# Patient Record
Sex: Female | Born: 1995 | Race: Black or African American | Hispanic: No | Marital: Single | Smoking: Never smoker
Health system: Southern US, Community
[De-identification: ages and names within clinical notes are randomized; demographics above are authoritative.]

## PROBLEM LIST (undated history)

## (undated) ENCOUNTER — Emergency Department (HOSPITAL_BASED_OUTPATIENT_CLINIC_OR_DEPARTMENT_OTHER): Admission: EM | Payer: No Typology Code available for payment source | Source: Home / Self Care

## (undated) DIAGNOSIS — Z789 Other specified health status: Secondary | ICD-10-CM

## (undated) HISTORY — PX: NO PAST SURGERIES: SHX2092

---
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
## 2014-04-25 ENCOUNTER — Inpatient Hospital Stay (HOSPITAL_COMMUNITY)
Admission: AD | Admit: 2014-04-25 | Discharge: 2014-04-25 | Discharge status: Against Medical Advice | Payer: Medicaid Other | Source: Ambulatory Visit | Attending: Obstetrics & Gynecology | Admitting: Obstetrics & Gynecology

## 2014-04-25 DIAGNOSIS — Z3200 Encounter for pregnancy test, result unknown: Secondary | ICD-10-CM | POA: Diagnosis present

## 2014-04-25 DIAGNOSIS — Z3202 Encounter for pregnancy test, result negative: Secondary | ICD-10-CM | POA: Diagnosis not present

## 2014-04-25 DIAGNOSIS — Z3046 Encounter for surveillance of implantable subdermal contraceptive: Secondary | ICD-10-CM | POA: Insufficient documentation

## 2014-04-25 LAB — POCT PREGNANCY, URINE: PREG TEST UR: NEGATIVE

## 2014-04-25 NOTE — MAU Note (Signed)
Denies pain, bleeding or discharge. No N/V.

## 2014-04-25 NOTE — MAU Note (Signed)
Not in lobby

## 2014-04-25 NOTE — MAU Note (Signed)
Not in lobby#1 

## 2014-04-25 NOTE — MAU Note (Signed)
Patient states she has had an Implanon for 2 years. States the implant site has been itching for 1-2 months. Wants a pregnancy test.

## 2014-11-06 ENCOUNTER — Encounter (HOSPITAL_BASED_OUTPATIENT_CLINIC_OR_DEPARTMENT_OTHER): Payer: Self-pay | Admitting: *Deleted

## 2014-11-06 ENCOUNTER — Emergency Department (HOSPITAL_BASED_OUTPATIENT_CLINIC_OR_DEPARTMENT_OTHER): Payer: Medicaid Other

## 2014-11-06 ENCOUNTER — Emergency Department (HOSPITAL_BASED_OUTPATIENT_CLINIC_OR_DEPARTMENT_OTHER)
Admission: EM | Admit: 2014-11-06 | Discharge: 2014-11-06 | Disposition: A | Discharge status: Home / Self Care | Payer: Medicaid Other | Attending: Emergency Medicine | Admitting: Emergency Medicine

## 2014-11-06 DIAGNOSIS — N898 Other specified noninflammatory disorders of vagina: Secondary | ICD-10-CM | POA: Insufficient documentation

## 2014-11-06 DIAGNOSIS — Z3202 Encounter for pregnancy test, result negative: Secondary | ICD-10-CM | POA: Insufficient documentation

## 2014-11-06 DIAGNOSIS — R109 Unspecified abdominal pain: Secondary | ICD-10-CM

## 2014-11-06 DIAGNOSIS — K5900 Constipation, unspecified: Secondary | ICD-10-CM | POA: Insufficient documentation

## 2014-11-06 DIAGNOSIS — R1084 Generalized abdominal pain: Secondary | ICD-10-CM

## 2014-11-06 DIAGNOSIS — R140 Abdominal distension (gaseous): Secondary | ICD-10-CM | POA: Insufficient documentation

## 2014-11-06 LAB — CBC WITH DIFFERENTIAL/PLATELET
BASOS ABS: 0 10*3/uL (ref 0.0–0.1)
Basophils Relative: 0 % (ref 0–1)
EOS ABS: 0.1 10*3/uL (ref 0.0–0.7)
EOS PCT: 1 % (ref 0–5)
HCT: 38.9 % (ref 36.0–46.0)
HEMOGLOBIN: 13.1 g/dL (ref 12.0–15.0)
LYMPHS ABS: 2.1 10*3/uL (ref 0.7–4.0)
Lymphocytes Relative: 22 % (ref 12–46)
MCH: 33.1 pg (ref 26.0–34.0)
MCHC: 33.7 g/dL (ref 30.0–36.0)
MCV: 98.2 fL (ref 78.0–100.0)
MONO ABS: 0.6 10*3/uL (ref 0.1–1.0)
MONOS PCT: 7 % (ref 3–12)
NEUTROS PCT: 70 % (ref 43–77)
Neutro Abs: 6.5 10*3/uL (ref 1.7–7.7)
PLATELETS: 337 10*3/uL (ref 150–400)
RBC: 3.96 MIL/uL (ref 3.87–5.11)
RDW: 11.6 % (ref 11.5–15.5)
WBC: 9.3 10*3/uL (ref 4.0–10.5)

## 2014-11-06 LAB — URINE MICROSCOPIC-ADD ON

## 2014-11-06 LAB — WET PREP, GENITAL
Trich, Wet Prep: NONE SEEN
Yeast Wet Prep HPF POC: NONE SEEN

## 2014-11-06 LAB — URINALYSIS, ROUTINE W REFLEX MICROSCOPIC
Bilirubin Urine: NEGATIVE
GLUCOSE, UA: NEGATIVE mg/dL
Hgb urine dipstick: NEGATIVE
Ketones, ur: NEGATIVE mg/dL
LEUKOCYTES UA: NEGATIVE
Nitrite: POSITIVE — AB
PH: 7.5 (ref 5.0–8.0)
PROTEIN: NEGATIVE mg/dL
Specific Gravity, Urine: 1.017 (ref 1.005–1.030)
UROBILINOGEN UA: 1 mg/dL (ref 0.0–1.0)

## 2014-11-06 LAB — BASIC METABOLIC PANEL
Anion gap: 4 — ABNORMAL LOW (ref 5–15)
BUN: 10 mg/dL (ref 6–23)
CALCIUM: 9 mg/dL (ref 8.4–10.5)
CO2: 28 mmol/L (ref 19–32)
CREATININE: 0.66 mg/dL (ref 0.50–1.10)
Chloride: 108 mmol/L (ref 96–112)
GFR calc non Af Amer: >90 mL/min (ref >90)
Glucose, Bld: 82 mg/dL (ref 70–99)
Potassium: 3.5 mmol/L (ref 3.5–5.1)
Sodium: 140 mmol/L (ref 135–145)

## 2014-11-06 LAB — PREGNANCY, URINE: PREG TEST UR: NEGATIVE

## 2014-11-06 LAB — HCG, QUANTITATIVE, PREGNANCY: hCG, Beta Chain, Quant, S: 1 m[IU]/mL (ref <5)

## 2014-11-06 NOTE — ED Notes (Signed)
Abdominal pain x 2 weeks. Vaginal discharge and constipation. LMP in October. Her abdomen looks gravid.

## 2014-11-06 NOTE — ED Provider Notes (Signed)
CSN: [id number]     Arrival date & time 11/06/14  1349 History   First MD Initiated Contact with Patient 11/06/14 1400     Chief Complaint  Patient presents with  . Abdominal Pain     (Consider location/radiation/quality/duration/timing/severity/associated sxs/prior Treatment) HPI Comments: Patient is a 19 year old female who presents with complaints of generalized abdominal discomfort that has been occurring intermittently for the past 2 weeks. She feels somewhat constipated and also reports a vaginal discharge. She denies any bleeding or spotting.  Her last menstrual period was in October and she admits to the possibility of pregnancy.  Patient is a 19 y.o. female presenting with abdominal pain. The history is provided by the patient.  Abdominal Pain Pain location:  Generalized Pain quality: cramping   Pain radiates to:  Does not radiate Pain severity:  Moderate Onset quality:  Gradual Duration:  2 weeks Timing:  Intermittent Progression:  Worsening Chronicity:  New Relieved by:  Nothing Worsened by:  Nothing tried   History reviewed. No pertinent past medical history. History reviewed. No pertinent past surgical history. No family history on file. History  Substance Use Topics  . Smoking status: Never Smoker   . Smokeless tobacco: Not on file  . Alcohol Use: No   OB History    No data available     Review of Systems  Gastrointestinal: Positive for abdominal pain.  All other systems reviewed and are negative.     Allergies  Review of patient's allergies indicates no known allergies.  Home Medications   Prior to Admission medications   Medication Sig Start Date End Date Taking? Authorizing Provider  traMADol (ULTRAM) 50 MG tablet Take 1 tablet (50 mg total) by mouth every 6 (six) hours as needed for pain. 12/22/12   Gerhard Munch, MD   BP 98/64 mmHg  Pulse 88  Temp(Src) 97.9 F (36.6 C) (Oral)  Resp 20  Ht  (1.651 m)  Wt 162 lb (73.483 kg)  BMI  26.96 kg/m2  SpO2 100%  LMP 07/05/2014 Physical Exam  Constitutional: She is oriented to person, place, and time. She appears well-developed and well-nourished. No distress.  HENT:  Head: Normocephalic and atraumatic.  Neck: Normal range of motion. Neck supple.  Cardiovascular: Normal rate and regular rhythm.  Exam reveals no gallop and no friction rub.   No murmur heard. Pulmonary/Chest: Effort normal and breath sounds normal. No respiratory distress. She has no wheezes.  Abdominal: Soft. Bowel sounds are normal. She exhibits distension. There is no tenderness.  The lower abdomen is somewhat distended and appears gravid.  Musculoskeletal: Normal range of motion.  Neurological: She is alert and oriented to person, place, and time.  Skin: Skin is warm and dry. She is not diaphoretic.  Nursing note and vitals reviewed.   ED Course  Procedures (including critical care time) Labs Review Labs Reviewed  WET PREP, GENITAL  PREGNANCY, URINE  URINALYSIS, ROUTINE W REFLEX MICROSCOPIC  BASIC METABOLIC PANEL  CBC WITH DIFFERENTIAL/PLATELET  HCG, QUANTITATIVE, PREGNANCY  GC/CHLAMYDIA PROBE AMP ([place])    Imaging Review No results found.   EKG Interpretation None      MDM   Final diagnoses:  None    Asian presents with complaints of abdominal discomfort, vaginal discharge, and constipation. There is mild tenderness to palpation in all 4 quadrants but no rebound and no guarding. There is no white count and ultrasound reveals no acute abnormalities. Pelvic examination was performed which revealed scant discharge and no significant abnormality  on the wet prep. She will be discharged home, to return as needed she develops other problems.    Geoffery Lyonsouglas Kendarious Gudino, MD 11/06/14 1520

## 2014-11-06 NOTE — Discharge Instructions (Signed)
Magnesium citrate: Drank the entire 10 ounce bottle mixed with equal parts Sprite or Gatorade for relief of constipation.  Return to the emergency department if your symptoms substantially worsen or change.   Abdominal Pain Many things can cause abdominal pain. Usually, abdominal pain is not caused by a disease and will improve without treatment. It can often be observed and treated at home. Your health care provider will do a physical exam and possibly order blood tests and X-rays to help determine the seriousness of your pain. However, in many cases, more time must pass before a clear cause of the pain can be found. Before that point, your health care provider may not know if you need more testing or further treatment. HOME CARE INSTRUCTIONS  Monitor your abdominal pain for any changes. The following actions may help to alleviate any discomfort you are experiencing:  Only take over-the-counter or prescription medicines as directed by your health care provider.  Do not take laxatives unless directed to do so by your health care provider.  Try a clear liquid diet (broth, tea, or water) as directed by your health care provider. Slowly move to a bland diet as tolerated. SEEK MEDICAL CARE IF:  You have unexplained abdominal pain.  You have abdominal pain associated with nausea or diarrhea.  You have pain when you urinate or have a bowel movement.  You experience abdominal pain that wakes you in the night.  You have abdominal pain that is worsened or improved by eating food.  You have abdominal pain that is worsened with eating fatty foods.  You have a fever. SEEK IMMEDIATE MEDICAL CARE IF:   Your pain does not go away within 2 hours.  You keep throwing up (vomiting).  Your pain is felt only in portions of the abdomen, such as the right side or the left lower portion of the abdomen.  You pass bloody or black tarry stools. MAKE SURE YOU:  Understand these instructions.   Will  watch your condition.   Will get help right away if you are not doing well or get worse.  Document Released: 07/01/2005 Document Revised: 09/26/2013 Document Reviewed: 05/31/2013 [facility] Patient Information 2015 [employer] [place]. This information is not intended to replace advice given to you by your health care provider. Make sure you discuss any questions you have with your health care provider.

## 2014-11-07 LAB — GC/CHLAMYDIA PROBE AMP (~~LOC~~) NOT AT ARMC
Chlamydia: NEGATIVE
Neisseria Gonorrhea: NEGATIVE

## 2014-12-27 ENCOUNTER — Emergency Department (HOSPITAL_BASED_OUTPATIENT_CLINIC_OR_DEPARTMENT_OTHER)
Admission: EM | Admit: 2014-12-27 | Discharge: 2014-12-27 | Disposition: A | Discharge status: Home / Self Care | Payer: Medicaid Other | Attending: Emergency Medicine | Admitting: Emergency Medicine

## 2014-12-27 ENCOUNTER — Encounter (HOSPITAL_BASED_OUTPATIENT_CLINIC_OR_DEPARTMENT_OTHER): Payer: Self-pay | Admitting: *Deleted

## 2014-12-27 DIAGNOSIS — Z3202 Encounter for pregnancy test, result negative: Secondary | ICD-10-CM | POA: Insufficient documentation

## 2014-12-27 DIAGNOSIS — N939 Abnormal uterine and vaginal bleeding, unspecified: Secondary | ICD-10-CM | POA: Insufficient documentation

## 2014-12-27 LAB — URINE MICROSCOPIC-ADD ON

## 2014-12-27 LAB — URINALYSIS, ROUTINE W REFLEX MICROSCOPIC
Bilirubin Urine: NEGATIVE
GLUCOSE, UA: NEGATIVE mg/dL
Ketones, ur: NEGATIVE mg/dL
Nitrite: NEGATIVE
PH: 8.5 — AB (ref 5.0–8.0)
PROTEIN: 30 mg/dL — AB
Specific Gravity, Urine: 1.02 (ref 1.005–1.030)
Urobilinogen, UA: 1 mg/dL (ref 0.0–1.0)

## 2014-12-27 LAB — CBC WITH DIFFERENTIAL/PLATELET
BASOS PCT: 0 % (ref 0–1)
Basophils Absolute: 0 10*3/uL (ref 0.0–0.1)
EOS PCT: 2 % (ref 0–5)
Eosinophils Absolute: 0.2 10*3/uL (ref 0.0–0.7)
HEMATOCRIT: 36.3 % (ref 36.0–46.0)
HEMOGLOBIN: 12 g/dL (ref 12.0–15.0)
Lymphocytes Relative: 20 % (ref 12–46)
Lymphs Abs: 1.5 10*3/uL (ref 0.7–4.0)
MCH: 32.5 pg (ref 26.0–34.0)
MCHC: 33.1 g/dL (ref 30.0–36.0)
MCV: 98.4 fL (ref 78.0–100.0)
MONO ABS: 0.6 10*3/uL (ref 0.1–1.0)
MONOS PCT: 8 % (ref 3–12)
NEUTROS ABS: 5.1 10*3/uL (ref 1.7–7.7)
Neutrophils Relative %: 70 % (ref 43–77)
Platelets: 301 10*3/uL (ref 150–400)
RBC: 3.69 MIL/uL — ABNORMAL LOW (ref 3.87–5.11)
RDW: 11.7 % (ref 11.5–15.5)
WBC: 7.4 10*3/uL (ref 4.0–10.5)

## 2014-12-27 LAB — BASIC METABOLIC PANEL
Anion gap: 5 (ref 5–15)
BUN: 9 mg/dL (ref 6–23)
CALCIUM: 8.7 mg/dL (ref 8.4–10.5)
CO2: 26 mmol/L (ref 19–32)
Chloride: 107 mmol/L (ref 96–112)
Creatinine, Ser: 0.66 mg/dL (ref 0.50–1.10)
GFR calc Af Amer: >90 mL/min (ref >90)
GFR calc non Af Amer: >90 mL/min (ref >90)
GLUCOSE: 94 mg/dL (ref 70–99)
Potassium: 3.7 mmol/L (ref 3.5–5.1)
SODIUM: 138 mmol/L (ref 135–145)

## 2014-12-27 LAB — PREGNANCY, URINE: PREG TEST UR: NEGATIVE

## 2014-12-27 NOTE — ED Provider Notes (Signed)
CSN: [id number]     Arrival date & time 12/27/14  1125 History   First MD Initiated Contact with Patient 12/27/14 1213     Chief Complaint  Patient presents with  . Vaginal Bleeding     (Consider location/radiation/quality/duration/timing/severity/associated sxs/prior Treatment) HPI This is a 19 year old female who presents the emergency department for evaluation of her heavy menstrual cycle. She has no significant past medical history. Patient states that she is going through 1 pad an hour, which is unusual for her." messing up my drawers." The patient is sexually active. She is not using any birth control. She does not wish to get pregnant. When asked if she is using any protection or forms of birth control. She states "well, having gotten pregnant yet." Patient denies any dizziness, racing or skipping in her heart, weakness, or lightheadedness. The patient has an appointment with a gynecologist on Monday. History reviewed. No pertinent past medical history. History reviewed. No pertinent past surgical history. No family history on file. History  Substance Use Topics  . Smoking status: Never Smoker   . Smokeless tobacco: Not on file  . Alcohol Use: No   OB History    No data available     Review of Systems  Constitutional: Negative for fever and chills.  HENT: Negative for trouble swallowing.   Respiratory: Negative for shortness of breath.   Cardiovascular: Negative for chest pain.  Gastrointestinal: Negative for nausea, vomiting, abdominal pain, diarrhea and constipation.  Genitourinary: Positive for vaginal bleeding and menstrual problem. Negative for dysuria, urgency, frequency, hematuria, flank pain, decreased urine volume, vaginal discharge, difficulty urinating, genital sores and pelvic pain.  Musculoskeletal: Negative for myalgias and arthralgias.  Skin: Negative for rash.  Neurological: Negative for numbness.  All other systems reviewed and are  negative.     Allergies  Review of patient's allergies indicates no known allergies.  Home Medications   Prior to Admission medications   Medication Sig Start Date End Date Taking? Authorizing Provider  traMADol (ULTRAM) 50 MG tablet Take 1 tablet (50 mg total) by mouth every 6 (six) hours as needed for pain. 12/22/12   Gerhard Munch, MD   BP 97/75 mmHg  Pulse 72  Temp(Src) 98 F (36.7 C) (Oral)  Resp 18  Ht 5' 5.5" (1.664 m)  Wt 162 lb (73.483 kg)  BMI 26.54 kg/m2  SpO2 100%  LMP 11/26/2014 Physical Exam  Constitutional: She is oriented to person, place, and time. She appears well-developed and well-nourished. No distress.  HENT:  Head: Normocephalic and atraumatic.  Eyes: Conjunctivae are normal. No scleral icterus.  Neck: Normal range of motion.  Cardiovascular: Normal rate, regular rhythm and normal heart sounds.  Exam reveals no gallop and no friction rub.   No murmur heard. Pulmonary/Chest: Effort normal and breath sounds normal. No respiratory distress.  Abdominal: Soft. Bowel sounds are normal. She exhibits no distension and no mass. There is no tenderness. There is no guarding.  Neurological: She is alert and oriented to person, place, and time.  Skin: Skin is warm and dry. She is not diaphoretic.    ED Course  Procedures (including critical care time) Labs Review Labs Reviewed  URINALYSIS, ROUTINE W REFLEX MICROSCOPIC - Abnormal; Notable for the following:    Color, Urine RED (*)    APPearance CLOUDY (*)    pH 8.5 (*)    Hgb urine dipstick LARGE (*)    Protein, ur 30 (*)    Leukocytes, UA SMALL (*)    All  other components within normal limits  CBC WITH DIFFERENTIAL/PLATELET - Abnormal; Notable for the following:    RBC 3.69 (*)    All other components within normal limits  URINE MICROSCOPIC-ADD ON - Abnormal; Notable for the following:    Squamous Epithelial / LPF MANY (*)    Bacteria, UA MANY (*)    All other components within normal limits   PREGNANCY, URINE  BASIC METABOLIC PANEL  HIV ANTIBODY (ROUTINE TESTING)  RPR    Imaging Review No results found.   EKG Interpretation None      MDM   Final diagnoses:  Vaginal bleeding   Filed Vitals:   12/27/14 1134 12/27/14 1137  BP:  97/75  Pulse: 72   Temp: 98 F (36.7 C)   TempSrc: Oral   Resp: 18   Height: 5' 5.5" (1.664 m)   Weight: 162 lb (73.483 kg)   SpO2: 100%    Patient states that she does not want to have a pelvic examination done at this time and that she needs to get to class this afternoon. Patient's blood pressure is low. However, I suspect it is her normal. The patient is able to stand up and sit down easily without signs of dizziness. She is not tachycardic and I doubt that her menstruation is heavy and off to cause symptomatic anemia in any way. Patient is advised to follow-up with her GYN clinic on Monday. I discussed reasons to seek immediate medical care in the emergency department. Her labs show normal hemoglobin, contaminated urine without signs of infection, negative pregnancy.  The patient appears reasonably screened and/or stabilized for discharge and I doubt any other medical condition or other [facility] requiring further screening, evaluation, or treatment in the ED at this time prior to discharge.     Arthor Captainbigail Darryon Bastin, PA-C 12/27/14 1259  Geoffery Lyonsouglas Delo, MD 12/27/14 [phone]

## 2014-12-27 NOTE — ED Notes (Addendum)
Heavier than normal menses. Wants to have a rash on her right lower leg looked at while here. Her BP is low.

## 2014-12-27 NOTE — Discharge Instructions (Signed)
Abnormal Uterine Bleeding Abnormal uterine bleeding can affect women at various stages in life, including teenagers, women in their reproductive years, pregnant women, and women who have reached menopause. Several kinds of uterine bleeding are considered abnormal, including:  Bleeding or spotting between periods.   Bleeding after sexual intercourse.   Bleeding that is heavier or more than normal.   Periods that last longer than usual.  Bleeding after menopause.  Many cases of abnormal uterine bleeding are minor and simple to treat, while others are more serious. Any type of abnormal bleeding should be evaluated by your health care provider. Treatment will depend on the cause of the bleeding. HOME CARE INSTRUCTIONS Monitor your condition for any changes. The following actions may help to alleviate any discomfort you are experiencing:  Avoid the use of tampons and douches as directed by your health care provider.  Change your pads frequently. You should get regular pelvic exams and Pap tests. Keep all follow-up appointments for diagnostic tests as directed by your health care provider.  SEEK MEDICAL CARE IF:   Your bleeding lasts more than 1 week.   You feel dizzy at times.  SEEK IMMEDIATE MEDICAL CARE IF:   You pass out.   You are changing pads every 15 to 30 minutes.   You have abdominal pain.  You have a fever.   You become sweaty or weak.   You are passing large blood clots from the vagina.   You start to feel nauseous and vomit. MAKE SURE YOU:   Understand these instructions.  Will watch your condition.  Will get help right away if you are not doing well or get worse. Document Released: 09/21/2005 Document Revised: 09/26/2013 Document Reviewed: 04/20/2013 [employer] Patient Information 2015 [employer]. This information is not intended to replace advice given to you by your health care provider. Make sure you discuss any questions you have with your  health care provider.  

## 2014-12-28 LAB — HIV ANTIBODY (ROUTINE TESTING W REFLEX): HIV Screen 4th Generation wRfx: NONREACTIVE

## 2014-12-28 LAB — RPR: RPR Ser Ql: NONREACTIVE

## 2015-06-24 ENCOUNTER — Emergency Department (HOSPITAL_BASED_OUTPATIENT_CLINIC_OR_DEPARTMENT_OTHER)
Admission: EM | Admit: 2015-06-24 | Discharge: 2015-06-24 | Disposition: A | Discharge status: Home / Self Care | Payer: Medicaid Other | Attending: Emergency Medicine | Admitting: Emergency Medicine

## 2015-06-24 ENCOUNTER — Encounter (HOSPITAL_BASED_OUTPATIENT_CLINIC_OR_DEPARTMENT_OTHER): Payer: Self-pay | Admitting: *Deleted

## 2015-06-24 DIAGNOSIS — N72 Inflammatory disease of cervix uteri: Secondary | ICD-10-CM

## 2015-06-24 DIAGNOSIS — N760 Acute vaginitis: Secondary | ICD-10-CM | POA: Insufficient documentation

## 2015-06-24 DIAGNOSIS — Z3202 Encounter for pregnancy test, result negative: Secondary | ICD-10-CM | POA: Insufficient documentation

## 2015-06-24 DIAGNOSIS — N939 Abnormal uterine and vaginal bleeding, unspecified: Secondary | ICD-10-CM | POA: Insufficient documentation

## 2015-06-24 DIAGNOSIS — B9689 Other specified bacterial agents as the cause of diseases classified elsewhere: Secondary | ICD-10-CM

## 2015-06-24 LAB — URINALYSIS, ROUTINE W REFLEX MICROSCOPIC
BILIRUBIN URINE: NEGATIVE
Glucose, UA: NEGATIVE mg/dL
KETONES UR: NEGATIVE mg/dL
NITRITE: NEGATIVE
PROTEIN: 30 mg/dL — AB
Specific Gravity, Urine: 1.015 (ref 1.005–1.030)
Urobilinogen, UA: 1 mg/dL (ref 0.0–1.0)
pH: 8 (ref 5.0–8.0)

## 2015-06-24 LAB — WET PREP, GENITAL
TRICH WET PREP: NONE SEEN
Yeast Wet Prep HPF POC: NONE SEEN

## 2015-06-24 LAB — URINE MICROSCOPIC-ADD ON

## 2015-06-24 LAB — PREGNANCY, URINE: Preg Test, Ur: NEGATIVE

## 2015-06-24 MED ORDER — CEFTRIAXONE SODIUM 250 MG IJ SOLR
250.0000 mg | Freq: Once | INTRAMUSCULAR | Status: DC
  Filled 2015-06-24: qty 250

## 2015-06-24 MED ORDER — LIDOCAINE HCL (PF) 1 % IJ SOLN
INTRAMUSCULAR | Status: AC
  Filled 2015-06-24: qty 5

## 2015-06-24 MED ORDER — DOXYCYCLINE HYCLATE 100 MG PO CAPS: 100.0000 mg | ORAL_CAPSULE | Freq: Two times a day (BID) | ORAL | Status: DC

## 2015-06-24 MED ORDER — AZITHROMYCIN 250 MG PO TABS
1000.0000 mg | ORAL_TABLET | Freq: Once | ORAL | Status: DC
  Filled 2015-06-24: qty 4

## 2015-06-24 MED ORDER — METRONIDAZOLE 500 MG PO TABS: 500.0000 mg | ORAL_TABLET | Freq: Two times a day (BID) | ORAL | Status: DC

## 2015-06-24 NOTE — ED Notes (Addendum)
Entered room to medicate pt to find room empty and gown on bed. Registration staff sts they saw pt ambulate out of ER. Pt did NOT receive meds or d/c instructions or Rx's.

## 2015-06-24 NOTE — Discharge Instructions (Signed)
Take flagyl and doxycycline as prescribed. Follow-up with women's outpatient clinic for further evaluation of vaginal bleeding.  Abnormal Uterine Bleeding Abnormal uterine bleeding can affect women at various stages in life, including teenagers, women in their reproductive years, pregnant women, and women who have reached menopause. Several kinds of uterine bleeding are considered abnormal, including:  Bleeding or spotting between periods.   Bleeding after sexual intercourse.   Bleeding that is heavier or more than normal.   Periods that last longer than usual.  Bleeding after menopause.  Many cases of abnormal uterine bleeding are minor and simple to treat, while others are more serious. Any type of abnormal bleeding should be evaluated by your health care provider. Treatment will depend on the cause of the bleeding. HOME CARE INSTRUCTIONS Monitor your condition for any changes. The following actions may help to alleviate any discomfort you are experiencing:  Avoid the use of tampons and douches as directed by your health care provider.  Change your pads frequently. You should get regular pelvic exams and Pap tests. Keep all follow-up appointments for diagnostic tests as directed by your health care provider.  SEEK MEDICAL CARE IF:   Your bleeding lasts more than 1 week.   You feel dizzy at times.  SEEK IMMEDIATE MEDICAL CARE IF:   You pass out.   You are changing pads every 15 to 30 minutes.   You have abdominal pain.  You have a fever.   You become sweaty or weak.   You are passing large blood clots from the vagina.   You start to feel nauseous and vomit. MAKE SURE YOU:   Understand these instructions.  Will watch your condition.  Will get help right away if you are not doing well or get worse. Document Released: 09/21/2005 Document Revised: 09/26/2013 Document Reviewed: 04/20/2013 [facility] Patient Information 2015 [place], [place]. This information is  not intended to replace advice given to you by your health care provider. Make sure you discuss any questions you have with your health care provider.  Bacterial Vaginosis Bacterial vaginosis is a vaginal infection that occurs when the normal balance of bacteria in the vagina is disrupted. It results from an overgrowth of certain bacteria. This is the most common vaginal infection in women of childbearing age. Treatment is important to prevent complications, especially in pregnant women, as it can cause a premature delivery. CAUSES  Bacterial vaginosis is caused by an increase in harmful bacteria that are normally present in smaller amounts in the vagina. Several different kinds of bacteria can cause bacterial vaginosis. However, the reason that the condition develops is not fully understood. RISK FACTORS Certain activities or behaviors can put you at an increased risk of developing bacterial vaginosis, including:  Having a new sex partner or multiple sex partners.  Douching.  Using an intrauterine device (IUD) for contraception. Women do not get bacterial vaginosis from toilet seats, bedding, swimming pools, or contact with objects around them. SIGNS AND SYMPTOMS  Some women with bacterial vaginosis have no signs or symptoms. Common symptoms include:  Grey vaginal discharge.  A fishlike odor with discharge, especially after sexual intercourse.  Itching or burning of the vagina and vulva.  Burning or pain with urination. DIAGNOSIS  Your health care provider will take a medical history and examine the vagina for signs of bacterial vaginosis. A sample of vaginal fluid may be taken. Your health care provider will look at this sample under a microscope to check for bacteria and abnormal cells. A vaginal  pH test may also be done.  TREATMENT  Bacterial vaginosis may be treated with antibiotic medicines. These may be given in the form of a pill or a vaginal cream. A second round of antibiotics  may be prescribed if the condition comes back after treatment.  HOME CARE INSTRUCTIONS   Only take over-the-counter or prescription medicines as directed by your health care provider.  If antibiotic medicine was prescribed, take it as directed. Make sure you finish it even if you start to feel better.  Do not have sex until treatment is completed.  Tell all sexual partners that you have a vaginal infection. They should see their health care provider and be treated if they have problems, such as a mild rash or itching.  Practice safe sex by using condoms and only having one sex partner. SEEK MEDICAL CARE IF:   Your symptoms are not improving after 3 days of treatment.  You have increased discharge or pain.  You have a fever. MAKE SURE YOU:   Understand these instructions.  Will watch your condition.  Will get help right away if you are not doing well or get worse. FOR MORE INFORMATION  Centers for Disease Control and Prevention, Division of STD Prevention: SolutionApps.[employer].za American Sexual Health Association (ASHA): www.ashastd.org  Document Released: 09/21/2005 Document Revised: 07/12/2013 Document Reviewed: 05/03/2013 [facility] Patient Information 2015 [place], [place]. This information is not intended to replace advice given to you by your health care provider. Make sure you discuss any questions you have with your health care provider.  Cervicitis Cervicitis is a soreness and swelling (inflammation) of the cervix. Your cervix is located at the bottom of your uterus. It opens up to the vagina. CAUSES   Sexually transmitted infections (STIs).   Allergic reaction.   Medicines or birth control devices that are put in the vagina.   Injury to the cervix.   Bacterial infections.  RISK FACTORS You are at greater risk if you:  Have unprotected sexual intercourse.  Have sexual intercourse with many partners.  Began sexual intercourse at an early age.  Have a history of  STIs. SYMPTOMS  There may be no symptoms. If symptoms occur, they may include:   Gray, white, yellow, or bad-smelling vaginal discharge.   Pain or itching of the area outside the vagina.   Painful sexual intercourse.   Lower abdominal or lower back pain, especially during intercourse.   Frequent urination.   Abnormal vaginal bleeding between periods, after sexual intercourse, or after menopause.   Pressure or a heavy feeling in the pelvis.  DIAGNOSIS  Diagnosis is made after a pelvic exam. Other tests may include:   Examination of any discharge under a microscope (wet prep).   A Pap test.  TREATMENT  Treatment will depend on the cause of cervicitis. If it is caused by an STI, both you and your partner will need to be treated. Antibiotic medicines will be given.  HOME CARE INSTRUCTIONS   Do not have sexual intercourse until your health care provider says it is okay.   Do not have sexual intercourse until your partner has been treated, if your cervicitis is caused by an STI.   Take your antibiotics as directed. Finish them even if you start to feel better.  SEEK MEDICAL CARE IF:  Your symptoms come back.   You have a fever.  MAKE SURE YOU:   Understand these instructions.  Will watch your condition.  Will get help right away if you are not doing  well or get worse. Document Released: 09/21/2005 Document Revised: 09/26/2013 Document Reviewed: 03/15/2013 [facility] Patient Information 2015 [place], [place]. This information is not intended to replace advice given to you by your health care provider. Make sure you discuss any questions you have with your health care provider.

## 2015-06-24 NOTE — ED Provider Notes (Signed)
CSN: [id number]     Arrival date & time 06/24/15  0859 History   First MD Initiated Contact with Patient 06/24/15 [phone]     Chief Complaint  Patient presents with  . Vaginal Bleeding     (Consider location/radiation/quality/duration/timing/severity/associated sxs/prior Treatment) HPI Comments: 19 year old female with vaginal spotting 2 days. LMP 06/10/2015 and lasted 5 days. 2 days ago, she began a small amount of spotting. Has not required pads or tampons. Denies vaginal discharge. Denies abdominal pain, pelvic pain, dyspareunia, fever, chills, nausea or vomiting. Does not use birth control and has unprotected sex.  Patient is a 19 y.o. female presenting with vaginal bleeding. The history is provided by the patient.  Vaginal Bleeding Quality:  Spotting Severity:  Mild Onset quality:  Gradual Duration:  2 days Timing:  Sporadic Progression:  Unchanged Chronicity:  New Menstrual history:  Regular Context: spontaneously   Relieved by:  None tried Worsened by:  Nothing tried Ineffective treatments:  None tried Associated symptoms: no abdominal pain, no fever, no nausea and no vaginal discharge   Risk factors: unprotected sex     History reviewed. No pertinent past medical history. History reviewed. No pertinent past surgical history. No family history on file. Social History  Substance Use Topics  . Smoking status: Never Smoker   . Smokeless tobacco: Never Used  . Alcohol Use: No   OB History    No data available     Review of Systems  Constitutional: Negative for fever.  Gastrointestinal: Negative for nausea and abdominal pain.  Genitourinary: Positive for vaginal bleeding. Negative for vaginal discharge.  All other systems reviewed and are negative.     Allergies  Review of patient's allergies indicates no known allergies.  Home Medications   Prior to Admission medications   Medication Sig Start Date End Date Taking? Authorizing Provider  doxycycline  (VIBRAMYCIN) 100 MG capsule Take 1 capsule (100 mg total) by mouth 2 (two) times daily. One po bid x 14 days 06/24/15   Kathrynn Speed, PA-C  metroNIDAZOLE (FLAGYL) 500 MG tablet Take 1 tablet (500 mg total) by mouth 2 (two) times daily. One po bid x 7 days 06/24/15   Kathrynn Speed, PA-C  traMADol (ULTRAM) 50 MG tablet Take 1 tablet (50 mg total) by mouth every 6 (six) hours as needed for pain. 12/22/12   Gerhard Munch, MD   BP 121/73 mmHg  Pulse 82  Temp(Src) 98.4 F (36.9 C) (Oral)  Resp 16  Ht 5' 5.5" (1.664 m)  Wt 175 lb (79.379 kg)  BMI 28.67 kg/m2  SpO2 100%  LMP 06/10/2015 Physical Exam  Constitutional: She is oriented to person, place, and time. She appears well-developed and well-nourished. No distress.  HENT:  Head: Normocephalic and atraumatic.  Mouth/Throat: Oropharynx is clear and moist.  Eyes: Conjunctivae and EOM are normal.  Neck: Normal range of motion. Neck supple.  Cardiovascular: Normal rate, regular rhythm and normal heart sounds.   Pulmonary/Chest: Effort normal and breath sounds normal. No respiratory distress.  Abdominal: Soft. Bowel sounds are normal. There is no tenderness.  Genitourinary: Uterus is not tender. Cervix exhibits discharge. Cervix exhibits no motion tenderness and no friability. Right adnexum displays no mass, no tenderness and no fullness. Left adnexum displays no mass, no tenderness and no fullness. There is bleeding in the vagina. Vaginal discharge found.  Musculoskeletal: Normal range of motion. She exhibits no edema.  Neurological: She is alert and oriented to person, place, and time. No sensory deficit.  Skin:  Skin is warm and dry.  Psychiatric: She has a normal mood and affect. Her behavior is normal.  Nursing note and vitals reviewed.   ED Course  Procedures (including critical care time) Labs Review Labs Reviewed  WET PREP, GENITAL - Abnormal; Notable for the following:    Clue Cells Wet Prep HPF POC FEW (*)    WBC, Wet Prep HPF POC  FEW (*)    All other components within normal limits  URINALYSIS, ROUTINE W REFLEX MICROSCOPIC (NOT AT [facility]) - Abnormal; Notable for the following:    APPearance CLOUDY (*)    Hgb urine dipstick LARGE (*)    Protein, ur 30 (*)    Leukocytes, UA TRACE (*)    All other components within normal limits  URINE MICROSCOPIC-ADD ON - Abnormal; Notable for the following:    Squamous Epithelial / LPF FEW (*)    Bacteria, UA FEW (*)    All other components within normal limits  PREGNANCY, URINE  GC/CHLAMYDIA PROBE AMP ([place]) NOT AT [facility]    Imaging Review No results found. I have personally reviewed and evaluated these images and lab results as part of my medical decision-making.   EKG Interpretation None      MDM   Final diagnoses:  Vaginal bleeding  BV (bacterial vaginosis)  Cervicitis   Nontoxic appearing, NAD. AF VSS. Abdomen soft and nontender. Has cervical discharge without CMT or adnexal tenderness. Urine pregnancy negative, unlikely spontaneous abortion. Uterus nontender. Wet prep significant for a few clue cells and white blood cells. Bleeding may be from cervicitis. Will treat with doxycycline. Flagyl for BV. Rocephin/azithro given ED. GC/Chlamydia cultures pending. Infection care/precautions discussed. Stable or d/c. Follow-up with ob/gyn. Return precautions given. Patient states understanding of treatment care plan and is agreeable.  Kathrynn Speed, PA-C 06/24/15 1119  Kathrynn Speed, PA-C 06/24/15 1119  Benjiman Core, MD 06/25/15 [phone]

## 2015-06-24 NOTE — ED Notes (Signed)
PA at bedside.

## 2015-06-24 NOTE — ED Notes (Signed)
Pt reports LMP started 9/5 and lasted 5 days. States began bleeding again 2 days ago. Had implanon removed last year. Does not use any birth control

## 2015-06-25 LAB — GC/CHLAMYDIA PROBE AMP (~~LOC~~) NOT AT ARMC
Chlamydia: NEGATIVE
Neisseria Gonorrhea: NEGATIVE

## 2015-10-06 NOTE — L&D Delivery Note (Signed)
Delivery Note At 4:31 AM a viable female was delivered via Vaginal, Spontaneous Delivery (Presentation: LOA).  APGAR: 8, 9; weight  .   Placenta status: delivered spontaneously, intact.  Cord: 3VC.  With the following complications: none.   Anesthesia: none  Episiotomy: None Lacerations: None Est. Blood Loss (mL):  75mL  Mom to postpartum.  Baby to Couplet care / Skin to Skin.  Tiffany Deleon JEHIEL 07/21/2016, 5:02 AM

## 2015-11-29 ENCOUNTER — Encounter (HOSPITAL_COMMUNITY): Payer: Self-pay

## 2015-11-29 ENCOUNTER — Emergency Department (HOSPITAL_COMMUNITY)
Admission: EM | Admit: 2015-11-29 | Discharge: 2015-11-29 | Disposition: A | Discharge status: Home / Self Care | Payer: Medicaid Other | Attending: Emergency Medicine | Admitting: Emergency Medicine

## 2015-11-29 DIAGNOSIS — R109 Unspecified abdominal pain: Secondary | ICD-10-CM | POA: Diagnosis not present

## 2015-11-29 DIAGNOSIS — O9989 Other specified diseases and conditions complicating pregnancy, childbirth and the puerperium: Secondary | ICD-10-CM | POA: Diagnosis not present

## 2015-11-29 DIAGNOSIS — O210 Mild hyperemesis gravidarum: Secondary | ICD-10-CM | POA: Insufficient documentation

## 2015-11-29 DIAGNOSIS — Z3A00 Weeks of gestation of pregnancy not specified: Secondary | ICD-10-CM | POA: Insufficient documentation

## 2015-11-29 DIAGNOSIS — Z3490 Encounter for supervision of normal pregnancy, unspecified, unspecified trimester: Secondary | ICD-10-CM

## 2015-11-29 LAB — CBC
HEMATOCRIT: 36.3 % (ref 36.0–46.0)
Hemoglobin: 12.1 g/dL (ref 12.0–15.0)
MCH: 32.7 pg (ref 26.0–34.0)
MCHC: 33.3 g/dL (ref 30.0–36.0)
MCV: 98.1 fL (ref 78.0–100.0)
PLATELETS: 259 10*3/uL (ref 150–400)
RBC: 3.7 MIL/uL — ABNORMAL LOW (ref 3.87–5.11)
RDW: 12.9 % (ref 11.5–15.5)
WBC: 7.6 10*3/uL (ref 4.0–10.5)

## 2015-11-29 LAB — URINALYSIS, ROUTINE W REFLEX MICROSCOPIC
BILIRUBIN URINE: NEGATIVE
GLUCOSE, UA: NEGATIVE mg/dL
HGB URINE DIPSTICK: NEGATIVE
Leukocytes, UA: NEGATIVE
Nitrite: NEGATIVE
PH: 6 (ref 5.0–8.0)
Protein, ur: NEGATIVE mg/dL
Specific Gravity, Urine: 1.026 (ref 1.005–1.030)

## 2015-11-29 LAB — COMPREHENSIVE METABOLIC PANEL
ALK PHOS: 67 U/L (ref 38–126)
ALT: 13 U/L — ABNORMAL LOW (ref 14–54)
ANION GAP: 10 (ref 5–15)
AST: 19 U/L (ref 15–41)
Albumin: 4.6 g/dL (ref 3.5–5.0)
BUN: 7 mg/dL (ref 6–20)
CO2: 22 mmol/L (ref 22–32)
CREATININE: 0.66 mg/dL (ref 0.44–1.00)
Calcium: 9.5 mg/dL (ref 8.9–10.3)
Chloride: 109 mmol/L (ref 101–111)
GLUCOSE: 90 mg/dL (ref 65–99)
Potassium: 3.9 mmol/L (ref 3.5–5.1)
SODIUM: 141 mmol/L (ref 135–145)
Total Bilirubin: 0.7 mg/dL (ref 0.3–1.2)
Total Protein: 7.9 g/dL (ref 6.5–8.1)

## 2015-11-29 LAB — LIPASE, BLOOD: LIPASE: 26 U/L (ref 11–51)

## 2015-11-29 LAB — I-STAT BETA HCG BLOOD, ED (MC, WL, AP ONLY): I-stat hCG, quantitative: >2000 m[IU]/mL — ABNORMAL HIGH (ref <5)

## 2015-11-29 MED ORDER — SODIUM CHLORIDE 0.9 % IV BOLUS (SEPSIS)
1000.0000 mL | Freq: Once | INTRAVENOUS | Status: AC
Start: 2015-11-29 — End: 2015-11-29
  Administered 2015-11-29: 1000 mL via INTRAVENOUS

## 2015-11-29 MED ORDER — ONDANSETRON HCL 4 MG/2ML IJ SOLN
4.0000 mg | Freq: Once | INTRAMUSCULAR | Status: AC
  Administered 2015-11-29: 4 mg via INTRAVENOUS
  Filled 2015-11-29: qty 2

## 2015-11-29 NOTE — ED Notes (Addendum)
Patient c/o N/V, mid abdominal pain and chills since yesterday.

## 2015-11-29 NOTE — Discharge Instructions (Signed)
Call the OB/GYN office listed above schedule a follow-up appointment and to establish care. Please return to the Emergency Department if symptoms worsen or new onset of fever, abdominal pain, vomiting, unable to keep fluids down, vaginal bleeding, vaginal discharge, difficulty breathing, chest pain.

## 2015-11-29 NOTE — ED Provider Notes (Signed)
CSN: [id number]     Arrival date & time 11/29/15  0800 History   First MD Initiated Contact with Patient 11/29/15 0818     Chief Complaint  Patient presents with  . Emesis  . Abdominal Pain     (Consider location/radiation/quality/duration/timing/severity/associated sxs/prior Treatment) HPI   Patient is a 20 year old female with no pertinent past medical history who presents the ED with complaint of vomiting, onset yesterday. Patient reports having nausea and NBNB vomiting since last night. Endorses associated chills and aching mid abdominal pain which she notes has improved since arrival to the ED. Denies fever, body aches, nasal congestion, sore throat, cough, SOB, CP, diarrhea, urinary sxs, vaginal bleeding, vaginal d/c. LMP end of Januray. Denies hx of abdominal surgeries. G0P0. Denies taking any medications PTA. Denies any recent sick contacts.   History reviewed. No pertinent past medical history. History reviewed. No pertinent past surgical history. History reviewed. No pertinent family history. Social History  Substance Use Topics  . Smoking status: Never Smoker   . Smokeless tobacco: Never Used  . Alcohol Use: No   OB History    No data available     Review of Systems  Gastrointestinal: Positive for nausea, vomiting and abdominal pain.  All other systems reviewed and are negative.     Allergies  Review of patient's allergies indicates no known allergies.  Home Medications   Prior to Admission medications   Not on File   BP 97/64 mmHg  Pulse 77  Temp(Src) 97.9 F (36.6 C) (Oral)  Resp 14  Ht 5' 5.5" (1.664 m)  Wt 63.277 kg  BMI 22.85 kg/m2  SpO2 100%  LMP 10/09/2015 Physical Exam  Constitutional: She is oriented to person, place, and time. She appears well-developed and well-nourished. No distress.  HENT:  Head: Normocephalic and atraumatic.  Mouth/Throat: Oropharynx is clear and moist. No oropharyngeal exudate.  Eyes: Conjunctivae and EOM are normal.  Pupils are equal, round, and reactive to light. Right eye exhibits no discharge. Left eye exhibits no discharge. No scleral icterus.  Neck: Normal range of motion. Neck supple.  Cardiovascular: Normal rate, regular rhythm, normal heart sounds and intact distal pulses.   Pulmonary/Chest: Effort normal and breath sounds normal. No respiratory distress. She has no wheezes. She has no rales. She exhibits no tenderness.  Abdominal: Soft. Bowel sounds are normal. She exhibits no distension and no mass. There is no tenderness. There is no rebound and no guarding.  Musculoskeletal: Normal range of motion. She exhibits no edema.  Lymphadenopathy:    She has no cervical adenopathy.  Neurological: She is alert and oriented to person, place, and time.  Skin: Skin is warm and dry. She is not diaphoretic.  Nursing note and vitals reviewed.   ED Course  Procedures (including critical care time) Labs Review Labs Reviewed  COMPREHENSIVE METABOLIC PANEL - Abnormal; Notable for the following:    ALT 13 (*)    All other components within normal limits  CBC - Abnormal; Notable for the following:    RBC 3.70 (*)    All other components within normal limits  URINALYSIS, ROUTINE W REFLEX MICROSCOPIC (NOT AT [facility]) - Abnormal; Notable for the following:    APPearance CLOUDY (*)    Ketones, ur >80 (*)    All other components within normal limits  I-STAT BETA HCG BLOOD, ED ([facility], [facility], [facility] ONLY) - Abnormal; Notable for the following:    I-stat hCG, quantitative >2000.0 (*)    All other components within normal  limits  LIPASE, BLOOD    Imaging Review No results found. I have personally reviewed and evaluated these images and lab results as part of my medical decision-making.   EKG Interpretation None      MDM   Final diagnoses:  Pregnant    Patient presents with nausea vomiting and intermittent abdominal pain that has resolved since arrival to the ED. VSS. Exam unremarkable. Patient given IV fluids and  Zofran. Labs and urine unremarkable. Pregnancy positive. Discussed results with patient. On reevaluation patient reports her nausea, vomiting and abdominal pain have resolved. Patient able to tolerate by mouth in the ED. Plan to discharge patient home with symptomatic treatment. Patient given resource guide and information to follow-up with OB/GYN to establish care.  Evaluation does not show pathology requring ongoing emergent intervention or admission. Pt is hemodynamically stable and mentating appropriately. Discussed findings/results and plan with patient/guardian, who agrees with plan. All questions answered. Return precautions discussed and outpatient follow up given.      Satira Sark [place], [place] 11/29/15 1609  Mancel Bale, MD 11/30/15 [phone]

## 2016-01-28 ENCOUNTER — Ambulatory Visit (INDEPENDENT_AMBULATORY_CARE_PROVIDER_SITE_OTHER): Payer: Self-pay | Admitting: Family

## 2016-01-28 ENCOUNTER — Encounter: Payer: Self-pay | Admitting: Family

## 2016-01-28 VITALS — BP 99/55 | HR 77 | Wt 144.5 lb

## 2016-01-28 DIAGNOSIS — Z3482 Encounter for supervision of other normal pregnancy, second trimester: Secondary | ICD-10-CM

## 2016-01-28 DIAGNOSIS — Z1389 Encounter for screening for other disorder: Secondary | ICD-10-CM

## 2016-01-28 DIAGNOSIS — Z3492 Encounter for supervision of normal pregnancy, unspecified, second trimester: Secondary | ICD-10-CM

## 2016-01-28 DIAGNOSIS — Z3490 Encounter for supervision of normal pregnancy, unspecified, unspecified trimester: Secondary | ICD-10-CM | POA: Insufficient documentation

## 2016-01-28 LAB — POCT URINALYSIS DIP (DEVICE)
BILIRUBIN URINE: NEGATIVE
GLUCOSE, UA: NEGATIVE mg/dL
Hgb urine dipstick: NEGATIVE
KETONES UR: NEGATIVE mg/dL
LEUKOCYTES UA: NEGATIVE
NITRITE: NEGATIVE
PH: 6 (ref 5.0–8.0)
Protein, ur: NEGATIVE mg/dL
Specific Gravity, Urine: 1.02 (ref 1.005–1.030)
Urobilinogen, UA: 0.2 mg/dL (ref 0.0–1.0)

## 2016-01-28 NOTE — Patient Instructions (Signed)

## 2016-01-28 NOTE — Progress Notes (Signed)
   Subjective:    Tiffany Deleon is a G1P0 7951w6d being seen today for her first obstetrical visit.  Her obstetrical history is significant for nausea and vomiting in first trimester that has since resolved.  Here with partner Tiffany Deleon. Patient does intend to breast feed. Pregnancy history fully reviewed.  Patient reports no complaints.  Filed Vitals:   01/28/16 0836  BP: 99/55  Pulse: 77  Weight: 144 lb 8 oz (65.545 kg)    HISTORY: OB History  Gravida Para Term Preterm AB SAB TAB Ectopic Multiple Living  1             # Outcome Date GA Lbr Len/2nd Weight Sex Delivery Anes PTL Lv  1 Current              History reviewed. No pertinent past medical history. History reviewed. No pertinent past surgical history. History reviewed. No pertinent family history.   Exam    Filed Vitals:   01/28/16 0836  BP: 99/55  Pulse: 77   System: Breast:  No nipple retraction or dimpling, No nipple discharge or bleeding, No axillary or supraclavicular adenopathy, Normal to palpation without dominant masses   Skin: normal coloration and turgor, no rashes    Neurologic: negative   Extremities: normal strength, tone, and muscle mass   HEENT neck supple with midline trachea and thyroid without masses   Mouth/Teeth mucous membranes moist, pharynx normal without lesions   Neck supple and no masses   Cardiovascular: regular rate and rhythm, no murmurs or gallops   Respiratory:  appears well, vitals normal, no respiratory distress, acyanotic, normal RR, neck free of mass or lymphadenopathy, chest clear, no wheezing, crepitations, rhonchi, normal symmetric air entry   Abdomen: soft, non-tender; bowel sounds normal; no masses,  no organomegaly      Assessment:    Pregnancy: G1P0 Patient Active Problem List   Diagnosis Date Noted  . Supervision of normal pregnancy, antepartum 01/28/2016        Plan:     Initial labs drawn. Prenatal vitamins. Problem list reviewed and updated. Genetic  Screening discussed Quad Screen: ordered.  Ultrasound discussed; fetal survey: ordered.  Follow up in 4 weeks.  Marlis EdelsonKARIM, Brynne Doane N 01/28/2016

## 2016-01-29 LAB — PRENATAL PROFILE (SOLSTAS)
ANTIBODY SCREEN: NEGATIVE
BASOS ABS: 0 {cells}/uL (ref 0–200)
Basophils Relative: 0 %
EOS ABS: 87 {cells}/uL (ref 15–500)
EOS PCT: 1 %
HCT: 35.3 % (ref 35.0–45.0)
HEMOGLOBIN: 11.9 g/dL (ref 11.7–15.5)
HIV 1&2 Ab, 4th Generation: NONREACTIVE
Hepatitis B Surface Ag: NEGATIVE
LYMPHS PCT: 19 %
Lymphs Abs: 1653 cells/uL (ref 850–3900)
MCH: 33.1 pg — AB (ref 27.0–33.0)
MCHC: 33.7 g/dL (ref 32.0–36.0)
MCV: 98.1 fL (ref 80.0–100.0)
MONOS PCT: 7 %
MPV: 10.2 fL (ref 7.5–12.5)
Monocytes Absolute: 609 cells/uL (ref 200–950)
NEUTROS ABS: 6351 {cells}/uL (ref 1500–7800)
NEUTROS PCT: 73 %
PLATELETS: 264 10*3/uL (ref 140–400)
RBC: 3.6 MIL/uL — ABNORMAL LOW (ref 3.80–5.10)
RDW: 13.6 % (ref 11.0–15.0)
RUBELLA: 1.25 {index} — AB (ref <0.90)
Rh Type: POSITIVE
WBC: 8.7 10*3/uL (ref 3.8–10.8)

## 2016-01-29 LAB — CULTURE, OB URINE
COLONY COUNT: NO GROWTH
Organism ID, Bacteria: NO GROWTH

## 2016-01-30 LAB — HEMOGLOBINOPATHY EVALUATION
HGB A: 92.4 % — AB (ref 96.8–97.8)
HGB S QUANTITAION: 0 %
Hemoglobin Other: 0 %
Hgb A2 Quant: 2.7 % (ref 2.2–3.2)
Hgb F Quant: 4.9 % — ABNORMAL HIGH (ref 0.0–2.0)

## 2016-01-31 LAB — AFP, QUAD SCREEN
AFP: 21.4 ng/mL
CURR GEST AGE: 15.9 wk
Down Syndrome Scr Risk Est: 1:478 {titer}
HCG, Total: 41.89 IU/mL
INH: 273.7 pg/mL
Interpretation-AFP: NEGATIVE
MOM FOR AFP: 0.56
MOM FOR HCG: 0.9
MOM FOR INH: 1.52
Open Spina bifida: NEGATIVE
Tri 18 Scr Risk Est: NEGATIVE
UE3 VALUE: 0.58 ng/mL
uE3 Mom: 0.72

## 2016-01-31 LAB — PRESCRIPTION MONITORING PROFILE (19 PANEL)
Amphetamine/Meth: NEGATIVE ng/mL
BARBITURATE SCREEN, URINE: NEGATIVE ng/mL
BENZODIAZEPINE SCREEN, URINE: NEGATIVE ng/mL
Buprenorphine, Urine: NEGATIVE ng/mL
COCAINE METABOLITES: NEGATIVE ng/mL
CREATININE, URINE: 109.74 mg/dL (ref >20.0)
Carisoprodol, Urine: NEGATIVE ng/mL
ECSTASY: NEGATIVE ng/mL
FENTANYL URINE: NEGATIVE ng/mL
Meperidine, Ur: NEGATIVE ng/mL
Methadone Screen, Urine: NEGATIVE ng/mL
Methaqualone: NEGATIVE ng/mL
Nitrites, Initial: NEGATIVE ug/mL
OPIATE SCREEN, URINE: NEGATIVE ng/mL
OXYCODONE SCRN UR: NEGATIVE ng/mL
PH URINE, INITIAL: 7.3 pH (ref 4.5–8.9)
PHENCYCLIDINE, UR: NEGATIVE ng/mL
Propoxyphene: NEGATIVE ng/mL
Tapentadol, urine: NEGATIVE ng/mL
Tramadol Scrn, Ur: NEGATIVE ng/mL
ZOLPIDEM, URINE: NEGATIVE ng/mL

## 2016-01-31 LAB — CANNABANOIDS (GC/LC/MS), URINE: THC-COOH (GC/LC/MS), ur confirm: 178 ng/mL — AB (ref <5)

## 2016-02-12 ENCOUNTER — Other Ambulatory Visit: Payer: Self-pay | Admitting: Family

## 2016-02-12 ENCOUNTER — Ambulatory Visit (HOSPITAL_COMMUNITY)
Admission: RE | Admit: 2016-02-12 | Discharge: 2016-02-12 | Disposition: A | Discharge status: Home / Self Care | Payer: Medicaid Other | Source: Ambulatory Visit | Attending: Family | Admitting: Family

## 2016-02-12 ENCOUNTER — Encounter (HOSPITAL_COMMUNITY): Payer: Self-pay

## 2016-02-12 DIAGNOSIS — O99322 Drug use complicating pregnancy, second trimester: Secondary | ICD-10-CM

## 2016-02-12 DIAGNOSIS — Z3A16 16 weeks gestation of pregnancy: Secondary | ICD-10-CM | POA: Diagnosis not present

## 2016-02-12 DIAGNOSIS — Z3482 Encounter for supervision of other normal pregnancy, second trimester: Secondary | ICD-10-CM

## 2016-02-12 DIAGNOSIS — Z36 Encounter for antenatal screening of mother: Secondary | ICD-10-CM | POA: Insufficient documentation

## 2016-02-12 DIAGNOSIS — Z1389 Encounter for screening for other disorder: Secondary | ICD-10-CM

## 2016-02-12 DIAGNOSIS — Z3689 Encounter for other specified antenatal screening: Secondary | ICD-10-CM

## 2016-02-25 ENCOUNTER — Encounter: Payer: Self-pay | Admitting: Obstetrics and Gynecology

## 2016-03-18 ENCOUNTER — Encounter: Payer: Self-pay | Admitting: Obstetrics and Gynecology

## 2016-03-18 ENCOUNTER — Ambulatory Visit (INDEPENDENT_AMBULATORY_CARE_PROVIDER_SITE_OTHER): Payer: Medicaid Other | Admitting: Obstetrics and Gynecology

## 2016-03-18 VITALS — BP 114/61 | HR 84 | Wt 151.3 lb

## 2016-03-18 DIAGNOSIS — Z3482 Encounter for supervision of other normal pregnancy, second trimester: Secondary | ICD-10-CM

## 2016-03-18 LAB — POCT URINALYSIS DIP (DEVICE)
Bilirubin Urine: NEGATIVE
GLUCOSE, UA: NEGATIVE mg/dL
Hgb urine dipstick: NEGATIVE
Leukocytes, UA: NEGATIVE
NITRITE: NEGATIVE
PH: 6.5 (ref 5.0–8.0)
PROTEIN: NEGATIVE mg/dL
Specific Gravity, Urine: 1.025 (ref 1.005–1.030)
UROBILINOGEN UA: 1 mg/dL (ref 0.0–1.0)

## 2016-03-18 MED ORDER — PRENATAL VITAMINS 0.8 MG PO TABS: 1.0000 | ORAL_TABLET | Freq: Every day | ORAL | Status: DC

## 2016-03-18 NOTE — Progress Notes (Signed)
Subjective:  Tiffany Deleon is a 20 y.o. G1P0 at 6210w4d being seen today for ongoing prenatal care.  She is currently monitored for the following issues for this low-risk pregnancy and has Supervision of normal pregnancy, antepartum on her problem list.  Patient reports no complaints.   Contractions: Not present. Vag. Bleeding: None.  Movement: Present. Denies leaking of fluid.   The following portions of the patient's history were reviewed and updated as appropriate: allergies, current medications, past family history, past medical history, past social history, past surgical history and problem list. Problem list updated.  Objective:   Filed Vitals:   03/18/16 1459  BP: 114/61  Pulse: 84  Weight: 151 lb 4.8 oz (68.629 kg)    Fetal Status: Fetal Heart Rate (bpm): 154   Movement: Present     General:  Alert, oriented and cooperative. Patient is in no acute distress.  Skin: Skin is warm and dry. No rash noted.   Cardiovascular: Normal heart rate noted  Respiratory: Normal respiratory effort, no problems with respiration noted  Abdomen: Soft, gravid, appropriate for gestational age. Pain/Pressure: Absent     Pelvic:  Cervical exam deferred        Extremities: Normal range of motion.  Edema: None  Mental Status: Normal mood and affect. Normal behavior. Normal judgment and thought content.   Urinalysis: Urine Protein: Negative Urine Glucose: Negative  Assessment and Plan:  Pregnancy: G1 at 9810w4d  1. Supervision of normal pregnancy, antepartum, second trimester Completion anatomy scan scheduled. Pt told to keep eye on rash and do lotions and let us know if it gets worse - Prenatal Multivit-Min-Fe-FA (PRENATAL VITAMINS) 0.8 MG tablet; Take 1 tablet by mouth daily.  Dispense: 30 tablet; Refill: 12 - US MFM OB FOLLOW UP; Future  Preterm labor symptoms and general obstetric precautions including but not limited to vaginal bleeding, contractions, leaking of fluid and fetal movement were  reviewed in detail with the patient. Please refer to After Visit Summary for other counseling recommendations.  No Follow-up on file.   [place] Bingharlie Shameka Aggarwal, MD

## 2016-03-18 NOTE — Progress Notes (Signed)
Breastfeeding tip reviewed Patient has a facial rash, x1week

## 2016-04-08 ENCOUNTER — Ambulatory Visit (HOSPITAL_COMMUNITY)
Admission: RE | Admit: 2016-04-08 | Discharge: 2016-04-08 | Disposition: A | Discharge status: Home / Self Care | Payer: Medicaid Other | Source: Ambulatory Visit | Attending: Obstetrics and Gynecology | Admitting: Obstetrics and Gynecology

## 2016-04-08 DIAGNOSIS — Z3A24 24 weeks gestation of pregnancy: Secondary | ICD-10-CM | POA: Insufficient documentation

## 2016-04-08 DIAGNOSIS — Z3482 Encounter for supervision of other normal pregnancy, second trimester: Secondary | ICD-10-CM

## 2016-04-08 DIAGNOSIS — Z36 Encounter for antenatal screening of mother: Secondary | ICD-10-CM | POA: Insufficient documentation

## 2016-04-08 DIAGNOSIS — O99322 Drug use complicating pregnancy, second trimester: Secondary | ICD-10-CM | POA: Insufficient documentation

## 2016-04-14 ENCOUNTER — Ambulatory Visit (INDEPENDENT_AMBULATORY_CARE_PROVIDER_SITE_OTHER): Payer: Medicaid Other | Admitting: Advanced Practice Midwife

## 2016-04-14 VITALS — BP 111/55 | HR 87 | Wt 160.0 lb

## 2016-04-14 DIAGNOSIS — Z3482 Encounter for supervision of other normal pregnancy, second trimester: Secondary | ICD-10-CM | POA: Diagnosis not present

## 2016-04-14 LAB — POCT URINALYSIS DIP (DEVICE)
BILIRUBIN URINE: NEGATIVE
Glucose, UA: NEGATIVE mg/dL
HGB URINE DIPSTICK: NEGATIVE
Ketones, ur: NEGATIVE mg/dL
NITRITE: NEGATIVE
PH: 6.5 (ref 5.0–8.0)
Protein, ur: NEGATIVE mg/dL
Specific Gravity, Urine: 1.02 (ref 1.005–1.030)
Urobilinogen, UA: 1 mg/dL (ref 0.0–1.0)

## 2016-04-14 NOTE — Patient Instructions (Signed)

## 2016-04-14 NOTE — Progress Notes (Signed)
Discussed breastfeeding with patient  

## 2016-04-16 NOTE — Progress Notes (Signed)
Subjective:  Tiffany Deleon is a 20 y.o. G1P0 at 8175w5d being seen today for ongoing prenatal care.  She is currently monitored for the following issues for this low-risk pregnancy and has Supervision of normal pregnancy, antepartum on her problem list.  Patient reports no complaints.  Contractions: Not present. Vag. Bleeding: None.  Movement: Present. Denies leaking of fluid.   The following portions of the patient's history were reviewed and updated as appropriate: allergies, current medications, past family history, past medical history, past social history, past surgical history and problem list. Problem list updated.  Objective:   Filed Vitals:   04/14/16 1527  BP: 111/55  Pulse: 87  Weight: 160 lb (72.576 kg)    Fetal Status: Fetal Heart Rate (bpm): 152 Fundal Height: 27 cm Movement: Present     General:  Alert, oriented and cooperative. Patient is in no acute distress.  Skin: Skin is warm and dry. No rash noted.   Cardiovascular: Normal heart rate noted  Respiratory: Normal respiratory effort, no problems with respiration noted  Abdomen: Soft, gravid, appropriate for gestational age. Pain/Pressure: Present-mild, constant  Pelvic:  Cervical exam declined         Extremities: Normal range of motion.  Edema: None  Mental Status: Normal mood and affect. Normal behavior. Normal judgment and thought content.   Urinalysis: Urine Protein: Negative Urine Glucose: Negative  Assessment and Plan:  Pregnancy: G1P0 at 8075w5d  1. Supervision of normal pregnancy, antepartum, second trimester   Preterm labor symptoms and general obstetric precautions including but not limited to vaginal bleeding, contractions, leaking of fluid and fetal movement were reviewed in detail with the patient. Please refer to After Visit Summary for other counseling recommendations.  Return in about 2 weeks (around 04/28/2016) for ROB/GTT.   Dorathy KinsmanVirginia Shanisha Lech, CNM

## 2016-04-29 ENCOUNTER — Ambulatory Visit (INDEPENDENT_AMBULATORY_CARE_PROVIDER_SITE_OTHER): Payer: Medicaid Other | Admitting: Obstetrics and Gynecology

## 2016-04-29 VITALS — BP 117/64 | HR 85 | Wt 166.1 lb

## 2016-04-29 DIAGNOSIS — Z3492 Encounter for supervision of normal pregnancy, unspecified, second trimester: Secondary | ICD-10-CM

## 2016-04-29 DIAGNOSIS — Z3482 Encounter for supervision of other normal pregnancy, second trimester: Secondary | ICD-10-CM

## 2016-04-29 LAB — CBC
HEMATOCRIT: 32 % — AB (ref 35.0–45.0)
HEMOGLOBIN: 10.6 g/dL — AB (ref 11.7–15.5)
MCH: 33 pg (ref 27.0–33.0)
MCHC: 33.1 g/dL (ref 32.0–36.0)
MCV: 99.7 fL (ref 80.0–100.0)
MPV: 10.4 fL (ref 7.5–12.5)
Platelets: 257 10*3/uL (ref 140–400)
RBC: 3.21 MIL/uL — ABNORMAL LOW (ref 3.80–5.10)
RDW: 12.8 % (ref 11.0–15.0)
WBC: 9.7 10*3/uL (ref 3.8–10.8)

## 2016-04-29 LAB — POCT URINALYSIS DIP (DEVICE)
BILIRUBIN URINE: NEGATIVE
GLUCOSE, UA: NEGATIVE mg/dL
HGB URINE DIPSTICK: NEGATIVE
Ketones, ur: NEGATIVE mg/dL
LEUKOCYTES UA: NEGATIVE
NITRITE: NEGATIVE
Protein, ur: NEGATIVE mg/dL
SPECIFIC GRAVITY, URINE: 1.015 (ref 1.005–1.030)
UROBILINOGEN UA: 0.2 mg/dL (ref 0.0–1.0)
pH: 7 (ref 5.0–8.0)

## 2016-04-29 NOTE — Progress Notes (Signed)
28 week labs, gtt today Wants to delay TDap till next visit

## 2016-04-29 NOTE — Progress Notes (Signed)
Subjective:  Tiffany Deleon is a 20 y.o. G1P0 at [redacted]w[redacted]d being seen today for ongoing prenatal care.  She is currently monitored for the following issues for this low-risk pregnancy and has Supervision of normal pregnancy, antepartum on her problem list.  Patient reports no complaints.  Contractions: Not present. Vag. Bleeding: None.  Movement: Present. Denies leaking of fluid.   The following portions of the patient's history were reviewed and updated as appropriate: allergies, current medications, past family history, past medical history, past social history, past surgical history and problem list. Problem list updated.  Objective:   Vitals:   04/29/16 0905  BP: 117/64  Pulse: 85  Weight: 166 lb 1.6 oz (75.3 kg)    Fetal Status: Fetal Heart Rate (bpm): 164   Movement: Present     General:  Alert, oriented and cooperative. Patient is in no acute distress.  Skin: Skin is warm and dry. No rash noted.   Cardiovascular: Normal heart rate noted  Respiratory: Normal respiratory effort, no problems with respiration noted  Abdomen: Soft, gravid, appropriate for gestational age. Pain/Pressure: Absent     Pelvic:  Cervical exam deferred        Extremities: Normal range of motion.  Edema: None  Mental Status: Normal mood and affect. Normal behavior. Normal judgment and thought content.   Urinalysis:      Assessment and Plan:  Pregnancy: G1P0 at [redacted]w[redacted]d  1. Supervision of normal pregnancy in second trimester Birth control and breast feeding discussed today.  - Glucose Tolerance, 1 HR (50g) - HIV antibody (with reflex) - RPR - CBC  Preterm labor symptoms and general obstetric precautions including but not limited to vaginal bleeding, contractions, leaking of fluid and fetal movement were reviewed in detail with the patient. Please refer to After Visit Summary for other counseling recommendations.  No Follow-up on file.   Lorne Skeens, MD

## 2016-04-29 NOTE — Patient Instructions (Signed)
Intrauterine Device Information An intrauterine device (IUD) is inserted into your uterus to prevent pregnancy. There are two types of IUDs available:   Copper IUD--This type of IUD is wrapped in copper wire and is placed inside the uterus. Copper makes the uterus and fallopian tubes produce a fluid that kills sperm. The copper IUD can stay in place for 10 years.  Hormone IUD--This type of IUD contains the hormone progestin (synthetic progesterone). The hormone thickens the cervical mucus and prevents sperm from entering the uterus. It also thins the uterine lining to prevent implantation of a fertilized egg. The hormone can weaken or kill the sperm that get into the uterus. One type of hormone IUD can stay in place for 5 years, and another type can stay in place for 3 years. Your health care provider will make sure you are a good candidate for a contraceptive IUD. Discuss with your health care provider the possible side effects.  ADVANTAGES OF AN INTRAUTERINE DEVICE  IUDs are highly effective, reversible, long acting, and low maintenance.   There are no estrogen-related side effects.   An IUD can be used when breastfeeding.   IUDs are not associated with weight gain.   The copper IUD works immediately after insertion.   The hormone IUD works right away if inserted within 7 days of your period starting. You will need to use a backup method of birth control for 7 days if the hormone IUD is inserted at any other time in your cycle.  The copper IUD does not interfere with your female hormones.   The hormone IUD can make heavy menstrual periods lighter and decrease cramping.   The hormone IUD can be used for 3 or 5 years.   The copper IUD can be used for 10 years. DISADVANTAGES OF AN INTRAUTERINE DEVICE  The hormone IUD can be associated with irregular bleeding patterns.   The copper IUD can make your menstrual flow heavier and more painful.   You may experience cramping and  vaginal bleeding after insertion.    This information is not intended to replace advice given to you by your health care provider. Make sure you discuss any questions you have with your health care provider.   Document Released: 08/25/2004 Document Revised: 05/24/2013 Document Reviewed: 03/12/2013 Elsevier Interactive Patient Education 2016 Elsevier Inc.  

## 2016-04-30 LAB — GLUCOSE TOLERANCE, 1 HOUR (50G) W/O FASTING: Glucose, 1 Hr, gestational: 80 mg/dL (ref <140)

## 2016-04-30 LAB — RPR

## 2016-04-30 LAB — HIV ANTIBODY (ROUTINE TESTING W REFLEX): HIV 1&2 Ab, 4th Generation: NONREACTIVE

## 2016-06-03 ENCOUNTER — Ambulatory Visit (INDEPENDENT_AMBULATORY_CARE_PROVIDER_SITE_OTHER): Payer: Medicaid Other | Admitting: Family

## 2016-06-03 VITALS — BP 118/64 | HR 90 | Wt 173.0 lb

## 2016-06-03 DIAGNOSIS — Z3483 Encounter for supervision of other normal pregnancy, third trimester: Secondary | ICD-10-CM

## 2016-06-03 DIAGNOSIS — Z3493 Encounter for supervision of normal pregnancy, unspecified, third trimester: Secondary | ICD-10-CM

## 2016-06-03 DIAGNOSIS — Z23 Encounter for immunization: Secondary | ICD-10-CM

## 2016-06-03 NOTE — Progress Notes (Signed)
   PRENATAL VISIT NOTE  Subjective:  Tiffany Deleon is a 20 y.o. G1P0 at 7416w4d being seen today for ongoing prenatal care.  She is currently monitored for the following issues for this low-risk pregnancy and has Supervision of normal pregnancy, antepartum on her problem list.  Patient reports no complaints.  Contractions: Not present. Vag. Bleeding: None.  Movement: Present. Denies leaking of fluid.   The following portions of the patient's history were reviewed and updated as appropriate: allergies, current medications, past family history, past medical history, past social history, past surgical history and problem list. Problem list updated.  Objective:   Vitals:   06/03/16 1058  BP: 118/64  Pulse: 90  Weight: 173 lb (78.5 kg)    Fetal Status: Fetal Heart Rate (bpm): 153 Fundal Height: 34 cm Movement: Present     General:  Alert, oriented and cooperative. Patient is in no acute distress.  Skin: Skin is warm and dry. No rash noted.   Cardiovascular: Normal heart rate noted  Respiratory: Normal respiratory effort, no problems with respiration noted  Abdomen: Soft, gravid, appropriate for gestational age. Pain/Pressure: Absent     Pelvic:  Cervical exam deferred        Extremities: Normal range of motion.  Edema: None  Mental Status: Normal mood and affect. Normal behavior. Normal judgment and thought content.   Urinalysis:     Urine results not available at discharge.    Assessment and Plan:  Pregnancy: G1P0 at 2916w4d  1. Supervision of normal pregnancy, antepartum, third trimester - Tdap today - Discussed contraception options > given handout   Preterm labor symptoms and general obstetric precautions including but not limited to vaginal bleeding, contractions, leaking of fluid and fetal movement were reviewed in detail with the patient. Please refer to After Visit Summary for other counseling recommendations.  Return in 2 weeks (on 06/17/2016).  Eino FarberWalidah Kennith GainN Karim, CNM

## 2016-06-03 NOTE — Progress Notes (Signed)
Patient unable to void today

## 2016-06-03 NOTE — Patient Instructions (Addendum)
AREA PEDIATRIC/FAMILY PRACTICE PHYSICIANS  [facility] [address] [place], [place] [address] Phone - [phone]   Fax - [phone]  [facility] [address] [place], [place]  [address] Phone - [phone]   Fax - [phone]  [facility] [address] [place], [place]  [address] Phone - [phone]   Fax - [phone]  [facility] [address] [place], [place]  [address] Phone - [phone]   Fax - [phone]  [facility] [address] [place], [place]  [address] Phone - [phone]   Fax - [phone]  [facility] [address] [place], [place]  [address] Phone - [phone]   Fax - [phone]  [facility] [place] [address] [place], [place]  [address] Phone - [phone]   Fax - [phone]  [facility] [address] [place], [address] [place], [place]  [address] Phone - [phone]   Fax - [phone]  [facility] AT [facility] [address] [place], [place]  [address] Phone - [phone]   Fax - [phone] [facility] [place] [address] [place], [place]  [address] Phone - [phone]   Fax - [phone]  [facility] [address] [place], [place]  [address] Phone - [phone]   Fax - [phone]  [facility] [address] [place], [place]  [address] Phone - [phone]   Fax - [phone]  [facility] [place] [address] [place], [place]  [address] Phone - [phone]   Fax - [phone]  [facility] [address] [place], [place]  [address] Phone - [phone]  [facility] [address] [place], [place]  [address] Phone - [phone]   Fax - [phone]  [facility] [address] [place], [place]  [address] Phone - [phone]   Fax - [phone]  [facility] [address] [place], [place]  [address] Phone - [phone]   Fax - [phone]  [facility] [address] [place], [place]  [address] Phone - [phone]   Fax - [phone]   [facility] [address] [place], [place]  [address] Phone - [phone]   Fax - [phone]  [facility] [address] [place], [place]  [address] Phone - [phone]   Fax - [phone]  [facility] RUBIN [address] [place], [place]  [address] Phone - [phone]   Fax - [phone]  [facility] [address] [place], [place]  [address] Phone - [phone]   Fax - [phone]  [facility] [address] [place], [place]  [address] Phone - [phone]   Fax - [phone] Gerarda Fraction [address] [place], [place]  [address] Phone - [phone]   Fax - [phone]  [facility] [address] [place], [place]  [address] Phone - [phone]   Fax - [phone]  [facility] [place] [address] [place], [place]  [address] Phone - [phone]   Fax - [phone]    Third Trimester of Pregnancy The third trimester is from week 29 through week 42, months 7 through 9. The third trimester is a time when the fetus is growing rapidly. At the  end of the ninth month, the fetus is about 20 inches in length and weighs 6-10 pounds.  BODY CHANGES Your body goes through many changes during pregnancy. The changes vary from woman to woman.   Your weight will continue to increase. You can expect to gain 25-35 pounds (11-16 kg) by the end of the pregnancy.  You may begin to get stretch marks on your hips, abdomen, and breasts.  You may urinate more often because the fetus is moving lower into your  pelvis and pressing on your bladder.  You may develop or continue to have heartburn as a result of your pregnancy.  You may develop constipation because certain hormones are causing the muscles that push waste through your intestines to slow down.  You may develop hemorrhoids or swollen, bulging veins (varicose veins).  You may have pelvic pain because of the weight gain and pregnancy hormones relaxing your joints between the bones in your pelvis. Backaches may result from overexertion of the muscles supporting your posture.  You may have changes in your hair. These can include thickening of your hair, rapid growth, and changes in texture. Some women also have hair loss during or after pregnancy, or hair that feels dry or thin. Your hair will most likely return to normal after your baby is born.  Your breasts will continue to grow and be tender. A yellow discharge may leak from your breasts called colostrum.  Your belly button may stick out.  You may feel short of breath because of your expanding uterus.  You may notice the fetus "dropping," or moving lower in your abdomen.  You may have a bloody mucus discharge. This usually occurs a few days to a week before labor begins.  Your cervix becomes thin and soft (effaced) near your due date. WHAT TO EXPECT AT YOUR PRENATAL EXAMS  You will have prenatal exams every 2 weeks until week 36. Then, you will have weekly prenatal exams. During a routine prenatal visit:  You will be weighed to make sure you and the fetus are growing normally.  Your blood pressure is taken.  Your abdomen will be measured to track your baby's growth.  The fetal heartbeat will be listened to.  Any test results from the previous visit will be discussed.  You may have a cervical check near your due date to see if you have effaced. At around 36 weeks, your caregiver will check your cervix. At the same time, your caregiver will also perform a test on the secretions  of the vaginal tissue. This test is to determine if a type of bacteria, Group B streptococcus, is present. Your caregiver will explain this further. Your caregiver may ask you:  What your birth plan is.  How you are feeling.  If you are feeling the baby move.  If you have had any abnormal symptoms, such as leaking fluid, bleeding, severe headaches, or abdominal cramping.  If you are using any tobacco products, including cigarettes, chewing tobacco, and electronic cigarettes.  If you have any questions. Other tests or screenings that may be performed during your third trimester include:  Blood tests that check for low iron levels (anemia).  Fetal testing to check the health, activity level, and growth of the fetus. Testing is done if you have certain medical conditions or if there are problems during the pregnancy.  HIV (human immunodeficiency virus) testing. If you are at high risk, you may be screened for HIV during your third trimester of pregnancy. FALSE   LABOR You may feel small, irregular contractions that eventually go away. These are called Braxton Hicks contractions, or false labor. Contractions may last for hours, days, or even weeks before true labor sets in. If contractions come at regular intervals, intensify, or become painful, it is best to be seen by your caregiver.  SIGNS OF LABOR   Menstrual-like cramps.  Contractions that are 5 minutes apart or less.  Contractions that start on the top of the uterus and spread down to the lower abdomen and back.  A sense of increased pelvic pressure or back pain.  A watery or bloody mucus discharge that comes from the vagina. If you have any of these signs before the 37th week of pregnancy, call your caregiver right away. You need to go to the hospital to get checked immediately. HOME CARE INSTRUCTIONS   Avoid all smoking, herbs, alcohol, and unprescribed drugs. These chemicals affect the formation and growth of the baby.  Do  not use any tobacco products, including cigarettes, chewing tobacco, and electronic cigarettes. If you need help quitting, ask your health care provider. You may receive counseling support and other resources to help you quit.  Follow your caregiver's instructions regarding medicine use. There are medicines that are either safe or unsafe to take during pregnancy.  Exercise only as directed by your caregiver. Experiencing uterine cramps is a good sign to stop exercising.  Continue to eat regular, healthy meals.  Wear a good support bra for breast tenderness.  Do not use hot tubs, steam rooms, or saunas.  Wear your seat belt at all times when driving.  Avoid raw meat, uncooked cheese, cat litter boxes, and soil used by cats. These carry germs that can cause birth defects in the baby.  Take your prenatal vitamins.  Take 1500-2000 mg of calcium daily starting at the 20th week of pregnancy until you deliver your baby.  Try taking a stool softener (if your caregiver approves) if you develop constipation. Eat more high-fiber foods, such as fresh vegetables or fruit and whole grains. Drink plenty of fluids to keep your urine clear or pale yellow.  Take warm sitz baths to soothe any pain or discomfort caused by hemorrhoids. Use hemorrhoid cream if your caregiver approves.  If you develop varicose veins, wear support hose. Elevate your feet for 15 minutes, 3-4 times a day. Limit salt in your diet.  Avoid heavy lifting, wear low heal shoes, and practice good posture.  Rest a lot with your legs elevated if you have leg cramps or low back pain.  Visit your dentist if you have not gone during your pregnancy. Use a soft toothbrush to brush your teeth and be gentle when you floss.  A sexual relationship may be continued unless your caregiver directs you otherwise.  Do not travel far distances unless it is absolutely necessary and only with the approval of your caregiver.  Take prenatal classes to  understand, practice, and ask questions about the labor and delivery.  Make a trial run to the hospital.  Pack your hospital bag.  Prepare the baby's nursery.  Continue to go to all your prenatal visits as directed by your caregiver. SEEK MEDICAL CARE IF:  You are unsure if you are in labor or if your water has broken.  You have dizziness.  You have mild pelvic cramps, pelvic pressure, or nagging pain in your abdominal area.  You have persistent nausea, vomiting, or diarrhea.  You have a bad smelling vaginal discharge.  You have   pain with urination. SEEK IMMEDIATE MEDICAL CARE IF:   You have a fever.  You are leaking fluid from your vagina.  You have spotting or bleeding from your vagina.  You have severe abdominal cramping or pain.  You have rapid weight loss or gain.  You have shortness of breath with chest pain.  You notice sudden or extreme swelling of your face, hands, ankles, feet, or legs.  You have not felt your baby move in over an hour.  You have severe headaches that do not go away with medicine.  You have vision changes.   This information is not intended to replace advice given to you by your health care provider. Make sure you discuss any questions you have with your health care provider.   Document Released: 09/15/2001 Document Revised: 10/12/2014 Document Reviewed: 11/22/2012 Elsevier Interactive Patient Education [employer]   Contraception Choices Birth control (contraception) is the use of any methods or devices to stop pregnancy from happening. Below are some methods to help avoid pregnancy. HORMONAL BIRTH CONTROL  A small tube put under the skin of the upper arm (implant). The tube can stay in place for 3 years. The implant must be taken out after 3 years.  Shots given every 3 months.  Pills taken every day.  Patches that are changed once a week.  A ring put into the vagina (vaginal ring). The ring is left in place for 3 weeks  and removed for 1 week. Then, a new ring is put in the vagina.  Emergency birth control pills taken after unprotected sex (intercourse). BARRIER BIRTH CONTROL   A thin covering worn on the penis (female condom) during sex.  A soft, loose covering put into the vagina (female condom) before sex.  A rubber bowl that sits over the cervix (diaphragm). The bowl must be made for you. The bowl is put into the vagina before sex. The bowl is left in place for 6 to 8 hours after sex.  A small, soft cup that fits over the cervix (cervical cap). The cup must be made for you. The cup can be left in place for 48 hours after sex.  A sponge that is put into the vagina before sex.  A chemical that kills or stops sperm from getting into the cervix and uterus (spermicide). The chemical may be a cream, jelly, foam, or pill. INTRAUTERINE (IUD) BIRTH CONTROL   IUD birth control is a small, T-shaped piece of plastic. The plastic is put inside the uterus. There are 2 types of IUD:  Copper IUD. The IUD is covered in copper wire. The copper makes a fluid that kills sperm. It can stay in place for 10 years.  Hormone IUD. The hormone stops pregnancy from happening. It can stay in place for 5 years. PERMANENT METHODS  When the woman has her fallopian tubes sealed, tied, or blocked during surgery. This stops the egg from traveling to the uterus.  The doctor places a small coil or insert into each fallopian tube. This causes scar tissue to form and blocks the fallopian tubes.  When the female has the tubes that carry sperm tied off (vasectomy). NATURAL FAMILY PLANNING BIRTH CONTROL   Natural family planning means not having sex or using barrier birth control on the days the woman could become pregnant.  Use a calendar to keep track of the length of each period and know the days she can get pregnant.  Avoid sex during ovulation.  Use a thermometer  to measure body temperature. Also watch for symptoms of  ovulation.  Time sex to be after the woman has ovulated. Use condoms to help protect yourself against sexually transmitted infections (STIs). Do this no matter what type of birth control you use. Talk to your doctor about which type of birth control is best for you.   This information is not intended to replace advice given to you by your health care provider. Make sure you discuss any questions you have with your health care provider.   Document Released: 07/19/2009 Document Revised: 09/26/2013 Document Reviewed: 04/12/2013 Elsevier Interactive Patient Education [employer]2016 Elsevier Inc.

## 2016-06-16 ENCOUNTER — Ambulatory Visit (INDEPENDENT_AMBULATORY_CARE_PROVIDER_SITE_OTHER): Payer: Medicaid Other | Admitting: Advanced Practice Midwife

## 2016-06-16 DIAGNOSIS — Z3483 Encounter for supervision of other normal pregnancy, third trimester: Secondary | ICD-10-CM

## 2016-06-16 LAB — POCT URINALYSIS DIP (DEVICE)
BILIRUBIN URINE: NEGATIVE
GLUCOSE, UA: NEGATIVE mg/dL
Hgb urine dipstick: NEGATIVE
Ketones, ur: NEGATIVE mg/dL
LEUKOCYTES UA: NEGATIVE
NITRITE: NEGATIVE
Protein, ur: NEGATIVE mg/dL
Specific Gravity, Urine: 1.02 (ref 1.005–1.030)
Urobilinogen, UA: 0.2 mg/dL (ref 0.0–1.0)
pH: 7.5 (ref 5.0–8.0)

## 2016-06-16 NOTE — Patient Instructions (Signed)

## 2016-06-16 NOTE — Progress Notes (Signed)
   PRENATAL VISIT NOTE  Subjective:  Tiffany Deleon is a 20 y.o. G1P0 at 5879w3d being seen today for ongoing prenatal care.  She is currently monitored for the following issues for this low-risk pregnancy and has Supervision of normal pregnancy, antepartum on her problem list.  Patient reports no complaints.  Contractions: Not present. Vag. Bleeding: None.  Movement: Present. Denies leaking of fluid.   The following portions of the patient's history were reviewed and updated as appropriate: allergies, current medications, past family history, past medical history, past social history, past surgical history and problem list. Problem list updated.  Objective:   Vitals:   06/16/16 0928  BP: (!) 86/68  Pulse: 92  Weight: 178 lb 9.6 oz (81 kg)    Fetal Status: Fetal Heart Rate (bpm): 152 Fundal Height: 35 cm Movement: Present     General:  Alert, oriented and cooperative. Patient is in no acute distress.  Skin: Skin is warm and dry. No rash noted.   Cardiovascular: Normal heart rate noted  Respiratory: Normal respiratory effort, no problems with respiration noted  Abdomen: Soft, gravid, appropriate for gestational age. Pain/Pressure: Present     Pelvic:  Cervical exam deferred        Extremities: Normal range of motion.  Edema: None  Mental Status: Normal mood and affect. Normal behavior. Normal judgment and thought content.   Urinalysis: Urine Protein: Negative Urine Glucose: Negative  Assessment and Plan:  Pregnancy: G1P0 at 7079w3d  1. Supervision of normal pregnancy, antepartum, third trimester --Anticipatory guidance for 36 week visit given.  Preterm labor symptoms and general obstetric precautions including but not limited to vaginal bleeding, contractions, leaking of fluid and fetal movement were reviewed in detail with the patient. Please refer to After Visit Summary for other counseling recommendations.  Return in about 2 weeks (around 06/30/2016).  Hurshel PartyLisa A Leftwich-Kirby,  CNM

## 2016-07-02 ENCOUNTER — Encounter: Payer: Medicaid Other | Admitting: Advanced Practice Midwife

## 2016-07-08 ENCOUNTER — Other Ambulatory Visit (HOSPITAL_COMMUNITY)
Admission: RE | Admit: 2016-07-08 | Discharge: 2016-07-08 | Disposition: A | Discharge status: Home / Self Care | Payer: Medicaid Other | Source: Ambulatory Visit | Attending: Obstetrics and Gynecology | Admitting: Obstetrics and Gynecology

## 2016-07-08 ENCOUNTER — Ambulatory Visit (INDEPENDENT_AMBULATORY_CARE_PROVIDER_SITE_OTHER): Payer: Medicaid Other | Admitting: Obstetrics and Gynecology

## 2016-07-08 VITALS — BP 114/80 | HR 86 | Wt 188.9 lb

## 2016-07-08 DIAGNOSIS — Z113 Encounter for screening for infections with a predominantly sexual mode of transmission: Secondary | ICD-10-CM | POA: Insufficient documentation

## 2016-07-08 DIAGNOSIS — Z3483 Encounter for supervision of other normal pregnancy, third trimester: Secondary | ICD-10-CM

## 2016-07-08 LAB — OB RESULTS CONSOLE GC/CHLAMYDIA: Gonorrhea: NEGATIVE

## 2016-07-08 LAB — OB RESULTS CONSOLE GBS: GBS: POSITIVE

## 2016-07-08 NOTE — Progress Notes (Signed)
Subjective:  Tiffany Deleon is a 20 y.o. G1P0 at 2440w4d being seen today for ongoing prenatal care.  She is currently monitored for the following issues for this low-risk pregnancy and has Supervision of normal pregnancy, antepartum on her problem list.  Patient reports no complaints.  Contractions: Irritability. Vag. Bleeding: None.  Movement: Present. Denies leaking of fluid.   The following portions of the patient's history were reviewed and updated as appropriate: allergies, current medications, past family history, past medical history, past social history, past surgical history and problem list. Problem list updated.  Objective:   Vitals:   07/08/16 1319  BP: 114/80  Pulse: 86  Weight: 188 lb 14.4 oz (85.7 kg)    Fetal Status: Fetal Heart Rate (bpm): 136   Movement: Present     General:  Alert, oriented and cooperative. Patient is in no acute distress.  Skin: Skin is warm and dry. No rash noted.   Cardiovascular: Normal heart rate noted  Respiratory: Normal respiratory effort, no problems with respiration noted  Abdomen: Soft, gravid, appropriate for gestational age. Pain/Pressure: Present     Pelvic:  Cervical exam performed        Extremities: Normal range of motion.  Edema: None  Mental Status: Normal mood and affect. Normal behavior. Normal judgment and thought content.   Urinalysis:      Assessment and Plan:  Pregnancy: G1P0 at 2740w4d  1. Encounter for supervision of other normal pregnancy in third trimester Decline flu vaccine - Culture, beta strep (group b only) - GC/Chlamydia probe amp ([place])not at [facility]  Term labor symptoms and general obstetric precautions including but not limited to vaginal bleeding, contractions, leaking of fluid and fetal movement were reviewed in detail with the patient. Please refer to After Visit Summary for other counseling recommendations.  Return in about 1 week (around 07/15/2016) for OB visit.   Hermina StaggersMichael L Iola Turri, MD

## 2016-07-08 NOTE — Progress Notes (Signed)
Pt declines flu shot.  

## 2016-07-08 NOTE — Patient Instructions (Signed)
Vaginal Delivery °During delivery, your health care provider will help you give birth to your baby. During a vaginal delivery, you will work to push the baby out of your vagina. However, before you can push your baby out, a few things need to happen. The opening of your uterus (cervix) has to soften, thin out, and open up (dilate) all the way to 10 cm. Also, your baby has to move down from the uterus into your vagina.  °SIGNS OF LABOR  °Your health care provider will first need to make sure you are in labor. Signs of labor include:  °· Passing what is called the mucous plug before labor begins. This is a small amount of blood-stained mucus. °· Having regular, painful uterine contractions.   °· The time between contractions gets shorter.   °· The discomfort and pain gradually get more intense. °· Contraction pains get worse when walking and do not go away when resting.   °· Your cervix becomes thinner (effacement) and dilates. °BEFORE THE DELIVERY °Once you are in labor and admitted into the hospital or care center, your health care provider may do the following:  °· Perform a complete physical exam. °· Review any complications related to pregnancy or labor.  °· Check your blood pressure, pulse, temperature, and heart rate (vital signs).   °· Determine if, and when, the rupture of amniotic membranes occurred. °· Do a vaginal exam (using a sterile glove and lubricant) to determine:   °¨ The position (presentation) of the baby. Is the baby's head presenting first (vertex) in the birth canal (vagina), or are the feet or buttocks first (breech)?   °¨ The level (station) of the baby's head within the birth canal.   °¨ The effacement and dilatation of the cervix.   °· An electronic fetal monitor is usually placed on your abdomen when you first arrive. This is used to monitor your contractions and the baby's heart rate. °¨ When the monitor is on your abdomen (external fetal monitor), it can only pick up the frequency and  length of your contractions. It cannot tell the strength of your contractions. °¨ If it becomes necessary for your health care provider to know exactly how strong your contractions are or to see exactly what the baby's heart rate is doing, an internal monitor may be inserted into your vagina and uterus. Your health care provider will discuss the benefits and risks of using an internal monitor and obtain your permission before inserting the device. °¨ Continuous fetal monitoring may be needed if you have an epidural, are receiving certain medicines (such as oxytocin), or have pregnancy or labor complications. °· An IV access tube may be placed into a vein in your arm to deliver fluids and medicines if necessary. °THREE STAGES OF LABOR AND DELIVERY °Normal labor and delivery is divided into three stages. °First Stage °This stage starts when you begin to contract regularly and your cervix begins to efface and dilate. It ends when your cervix is completely open (fully dilated). The first stage is the longest stage of labor and can last from 3 hours to 15 hours.  °Several methods are available to help with labor pain. You and your health care provider will decide which option is best for you. Options include:  °· Opioid medicines. These are strong pain medicines that you can get through your IV tube or as a shot into your muscle. These medicines lessen pain but do not make it go away completely.  °· Epidural. A medicine is given through a thin tube that   is inserted in your back. The medicine numbs the lower part of your body and prevents any pain in that area. °· Paracervical pain medicine. This is an injection of an anesthetic on each side of your cervix.   °· You may request natural childbirth, which does not involve the use of pain medicines or an epidural during labor and delivery. Instead, you will use other things, such as breathing exercises, to help cope with the pain. °Second Stage °The second stage of labor  begins when your cervix is fully dilated at 10 cm. It continues until you push your baby down through the birth canal and the baby is born. This stage can take only minutes or several hours. °· The location of your baby's head as it moves through the birth canal is reported as a number called a station. If the baby's head has not started its descent, the station is described as being at minus 3 (-3). When your baby's head is at the zero station, it is at the middle of the birth canal and is engaged in the pelvis. The station of your baby helps indicate the progress of the second stage of labor. °· When your baby is born, your health care provider may hold the baby with his or her head lowered to prevent amniotic fluid, mucus, and blood from getting into the baby's lungs. The baby's mouth and nose may be suctioned with a small bulb syringe to remove any additional fluid. °· Your health care provider may then place the baby on your stomach. It is important to keep the baby from getting cold. To do this, the health care provider will dry the baby off, place the baby directly on your skin (with no blankets between you and the baby), and cover the baby with warm, dry blankets.   °· The umbilical cord is cut. °Third Stage °During the third stage of labor, your health care provider will deliver the placenta (afterbirth) and make sure your bleeding is under control. The delivery of the placenta usually takes about 5 minutes but can take up to 30 minutes. After the placenta is delivered, a medicine may be given either by IV or injection to help contract the uterus and control bleeding. If you are planning to breastfeed, you can try to do so now. °After you deliver the placenta, your uterus should contract and get very firm. If your uterus does not remain firm, your health care provider will massage it. This is important because the contraction of the uterus helps cut off bleeding at the site where the placenta was attached  to your uterus. If your uterus does not contract properly and stay firm, you may continue to bleed heavily. If there is a lot of bleeding, medicines may be given to contract the uterus and stop the bleeding.  °  °This information is not intended to replace advice given to you by your health care provider. Make sure you discuss any questions you have with your health care provider. °  °Document Released: 06/30/2008 Document Revised: 10/12/2014 Document Reviewed: 05/18/2012 °Elsevier Interactive Patient Education ©2016 [employer] ° °

## 2016-07-09 LAB — CULTURE, BETA STREP (GROUP B ONLY)

## 2016-07-10 ENCOUNTER — Encounter: Payer: Self-pay | Admitting: Obstetrics and Gynecology

## 2016-07-10 DIAGNOSIS — O99820 Streptococcus B carrier state complicating pregnancy: Secondary | ICD-10-CM | POA: Insufficient documentation

## 2016-07-10 LAB — GC/CHLAMYDIA PROBE AMP (~~LOC~~) NOT AT ARMC
CHLAMYDIA, DNA PROBE: NEGATIVE
Neisseria Gonorrhea: NEGATIVE

## 2016-07-14 ENCOUNTER — Telehealth: Payer: Self-pay

## 2016-07-14 NOTE — Telephone Encounter (Signed)
Patient called stating she is having some back pain and wanted to know if this was normal. She also noticed a little blood yesterday after wiping but none today. I advised patient the back pain is normal during this stage in pregnancy. I also advised patient to continued to look out for signs of labor such as, pain that will not go after resting. Contractions that are getting closer together. She should report to MAU to be check out.  Patient has appointment coming up on this Thursday.

## 2016-07-16 ENCOUNTER — Ambulatory Visit (INDEPENDENT_AMBULATORY_CARE_PROVIDER_SITE_OTHER): Payer: Medicaid Other | Admitting: Obstetrics and Gynecology

## 2016-07-16 VITALS — BP 121/71 | HR 89 | Wt 190.0 lb

## 2016-07-16 DIAGNOSIS — Z3403 Encounter for supervision of normal first pregnancy, third trimester: Secondary | ICD-10-CM

## 2016-07-16 DIAGNOSIS — O99820 Streptococcus B carrier state complicating pregnancy: Secondary | ICD-10-CM

## 2016-07-16 DIAGNOSIS — Z3400 Encounter for supervision of normal first pregnancy, unspecified trimester: Secondary | ICD-10-CM

## 2016-07-16 NOTE — Progress Notes (Signed)
   PRENATAL VISIT NOTE  Subjective:  Tiffany Deleon is a 20 y.o. G1P0 at 4224w5d being seen today for ongoing prenatal care.  She is currently monitored for the following issues for this low-risk pregnancy and has Supervision of normal pregnancy, antepartum and GBS (group B Streptococcus carrier), +RV culture, currently pregnant on her problem list.  Patient reports no complaints.  Contractions: Not present. Vag. Bleeding: None.  Movement: Present. Denies leaking of fluid.   The following portions of the patient's history were reviewed and updated as appropriate: allergies, current medications, past family history, past medical history, past social history, past surgical history and problem list. Problem list updated.  Objective:   Vitals:   07/16/16 1248  BP: 121/71  Pulse: 89  Weight: 190 lb (86.2 kg)    Fetal Status: Fetal Heart Rate (bpm): 138 Fundal Height: 39 cm Movement: Present     General:  Alert, oriented and cooperative. Patient is in no acute distress.  Skin: Skin is warm and dry. No rash noted.   Cardiovascular: Normal heart rate noted  Respiratory: Normal respiratory effort, no problems with respiration noted  Abdomen: Soft, gravid, appropriate for gestational age. Pain/Pressure: Present     Pelvic:  Cervical exam deferred        Extremities: Normal range of motion.  Edema: None  Mental Status: Normal mood and affect. Normal behavior. Normal judgment and thought content.   Urinalysis:      Assessment and Plan:  Pregnancy: G1P0 at 3224w5d  1. Supervision of normal first pregnancy, antepartum Patient is doing well without complaints Culture results reviewed with the patient  2. GBS (group B Streptococcus carrier), +RV culture, currently pregnant Will receive prophylaxis in labor  Term labor symptoms and general obstetric precautions including but not limited to vaginal bleeding, contractions, leaking of fluid and fetal movement were reviewed in detail with the  patient. Please refer to After Visit Summary for other counseling recommendations.  Return in about 1 week (around 07/23/2016).  Catalina AntiguaPeggy Valerie Cones, MD

## 2016-07-20 ENCOUNTER — Inpatient Hospital Stay (HOSPITAL_COMMUNITY)
Admission: AD | Admit: 2016-07-20 | Discharge: 2016-07-20 | Disposition: A | Discharge status: Home / Self Care | Payer: Medicaid Other | Source: Ambulatory Visit | Attending: Family Medicine | Admitting: Family Medicine

## 2016-07-20 ENCOUNTER — Encounter (HOSPITAL_COMMUNITY): Payer: Self-pay | Admitting: *Deleted

## 2016-07-20 DIAGNOSIS — Z3400 Encounter for supervision of normal first pregnancy, unspecified trimester: Secondary | ICD-10-CM

## 2016-07-20 NOTE — MAU Note (Signed)
C/O pain in lower abdomen X 2 days. States pain is intermittent. Denies bleeding, leaking or unusual vaginal D/C. + FM.

## 2016-07-20 NOTE — Discharge Instructions (Signed)
Keep your scheduled appt for prenatal care. Call the office or nurse line with further concerns or return to MAU as needed.

## 2016-07-20 NOTE — MAU Note (Signed)
Urine in lab 

## 2016-07-21 ENCOUNTER — Inpatient Hospital Stay (HOSPITAL_COMMUNITY)
Admission: AD | Admit: 2016-07-21 | Discharge: 2016-07-22 | DRG: 775 | Disposition: A | Discharge status: Home / Self Care | Payer: Medicaid Other | Source: Ambulatory Visit | Attending: Family Medicine | Admitting: Family Medicine

## 2016-07-21 ENCOUNTER — Encounter (HOSPITAL_COMMUNITY): Payer: Self-pay | Admitting: *Deleted

## 2016-07-21 ENCOUNTER — Inpatient Hospital Stay (HOSPITAL_COMMUNITY): Payer: Medicaid Other | Admitting: Anesthesiology

## 2016-07-21 DIAGNOSIS — Z87891 Personal history of nicotine dependence: Secondary | ICD-10-CM

## 2016-07-21 DIAGNOSIS — O4202 Full-term premature rupture of membranes, onset of labor within 24 hours of rupture: Principal | ICD-10-CM | POA: Diagnosis present

## 2016-07-21 DIAGNOSIS — O99824 Streptococcus B carrier state complicating childbirth: Secondary | ICD-10-CM | POA: Diagnosis not present

## 2016-07-21 DIAGNOSIS — Z3A39 39 weeks gestation of pregnancy: Secondary | ICD-10-CM

## 2016-07-21 DIAGNOSIS — Z3400 Encounter for supervision of normal first pregnancy, unspecified trimester: Secondary | ICD-10-CM

## 2016-07-21 DIAGNOSIS — O99820 Streptococcus B carrier state complicating pregnancy: Secondary | ICD-10-CM

## 2016-07-21 LAB — CBC
HCT: 34.9 % — ABNORMAL LOW (ref 36.0–46.0)
Hemoglobin: 11.9 g/dL — ABNORMAL LOW (ref 12.0–15.0)
MCH: 33.1 pg (ref 26.0–34.0)
MCHC: 34.1 g/dL (ref 30.0–36.0)
MCV: 97.2 fL (ref 78.0–100.0)
PLATELETS: 269 10*3/uL (ref 150–400)
RBC: 3.59 MIL/uL — AB (ref 3.87–5.11)
RDW: 12.6 % (ref 11.5–15.5)
WBC: 15.1 10*3/uL — ABNORMAL HIGH (ref 4.0–10.5)

## 2016-07-21 LAB — TYPE AND SCREEN
ABO/RH(D): A POS
Antibody Screen: NEGATIVE

## 2016-07-21 LAB — POCT FERN TEST: POCT FERN TEST: POSITIVE

## 2016-07-21 LAB — RPR: RPR: NONREACTIVE

## 2016-07-21 LAB — ABO/RH: ABO/RH(D): A POS

## 2016-07-21 MED ORDER — DIPHENHYDRAMINE HCL 50 MG/ML IJ SOLN: 12.5000 mg | INTRAMUSCULAR | Status: DC | PRN

## 2016-07-21 MED ORDER — FLEET ENEMA 7-19 GM/118ML RE ENEM: 1.0000 | ENEMA | RECTAL | Status: DC | PRN

## 2016-07-21 MED ORDER — PHENYLEPHRINE 40 MCG/ML (10ML) SYRINGE FOR IV PUSH (FOR BLOOD PRESSURE SUPPORT)
80.0000 ug | PREFILLED_SYRINGE | INTRAVENOUS | Status: DC | PRN
  Filled 2016-07-21: qty 10
  Filled 2016-07-21: qty 5

## 2016-07-21 MED ORDER — BENZOCAINE-MENTHOL 20-0.5 % EX AERO: 1.0000 "application " | INHALATION_SPRAY | CUTANEOUS | Status: DC | PRN

## 2016-07-21 MED ORDER — OXYCODONE-ACETAMINOPHEN 5-325 MG PO TABS: 2.0000 | ORAL_TABLET | ORAL | Status: DC | PRN

## 2016-07-21 MED ORDER — EPHEDRINE 5 MG/ML INJ
10.0000 mg | INTRAVENOUS | Status: DC | PRN
  Filled 2016-07-21: qty 4

## 2016-07-21 MED ORDER — ZOLPIDEM TARTRATE 5 MG PO TABS: 5.0000 mg | ORAL_TABLET | Freq: Every evening | ORAL | Status: DC | PRN

## 2016-07-21 MED ORDER — PHENYLEPHRINE 40 MCG/ML (10ML) SYRINGE FOR IV PUSH (FOR BLOOD PRESSURE SUPPORT)
80.0000 ug | PREFILLED_SYRINGE | INTRAVENOUS | Status: DC | PRN
  Filled 2016-07-21: qty 5
  Filled 2016-07-21: qty 10

## 2016-07-21 MED ORDER — ACETAMINOPHEN 325 MG PO TABS: 650.0000 mg | ORAL_TABLET | ORAL | Status: DC | PRN

## 2016-07-21 MED ORDER — PENICILLIN G POTASSIUM 5000000 UNITS IJ SOLR
2.5000 10*6.[IU] | INTRAMUSCULAR | Status: DC
  Filled 2016-07-21 (×2): qty 2.5

## 2016-07-21 MED ORDER — SOD CITRATE-CITRIC ACID 500-334 MG/5ML PO SOLN: 30.0000 mL | ORAL | Status: DC | PRN

## 2016-07-21 MED ORDER — ONDANSETRON HCL 4 MG PO TABS: 4.0000 mg | ORAL_TABLET | ORAL | Status: DC | PRN

## 2016-07-21 MED ORDER — LACTATED RINGERS IV SOLN
500.0000 mL | Freq: Once | INTRAVENOUS | Status: AC
  Administered 2016-07-21: 500 mL via INTRAVENOUS

## 2016-07-21 MED ORDER — PRENATAL MULTIVITAMIN CH: 1.0000 | ORAL_TABLET | Freq: Every day | ORAL | Status: DC

## 2016-07-21 MED ORDER — TETANUS-DIPHTH-ACELL PERTUSSIS 5-2.5-18.5 LF-MCG/0.5 IM SUSP: 0.5000 mL | Freq: Once | INTRAMUSCULAR | Status: DC

## 2016-07-21 MED ORDER — DIBUCAINE 1 % RE OINT: 1.0000 "application " | TOPICAL_OINTMENT | RECTAL | Status: DC | PRN

## 2016-07-21 MED ORDER — COCONUT OIL OIL: 1.0000 "application " | TOPICAL_OIL | Status: DC | PRN

## 2016-07-21 MED ORDER — LACTATED RINGERS IV SOLN: 500.0000 mL | INTRAVENOUS | Status: DC | PRN

## 2016-07-21 MED ORDER — IBUPROFEN 600 MG PO TABS
600.0000 mg | ORAL_TABLET | Freq: Four times a day (QID) | ORAL | Status: DC
  Administered 2016-07-21: 600 mg via ORAL
  Filled 2016-07-21: qty 1

## 2016-07-21 MED ORDER — COMPLETENATE 29-1 MG PO CHEW
1.0000 | CHEWABLE_TABLET | Freq: Every day | ORAL | Status: DC
  Administered 2016-07-21 – 2016-07-22 (×2): 1 via ORAL
  Filled 2016-07-21 (×2): qty 1

## 2016-07-21 MED ORDER — OXYCODONE-ACETAMINOPHEN 5-325 MG PO TABS: 1.0000 | ORAL_TABLET | ORAL | Status: DC | PRN

## 2016-07-21 MED ORDER — LIDOCAINE HCL (PF) 1 % IJ SOLN
30.0000 mL | INTRAMUSCULAR | Status: DC | PRN
  Filled 2016-07-21: qty 30

## 2016-07-21 MED ORDER — SIMETHICONE 80 MG PO CHEW: 80.0000 mg | CHEWABLE_TABLET | ORAL | Status: DC | PRN

## 2016-07-21 MED ORDER — FENTANYL 2.5 MCG/ML BUPIVACAINE 1/10 % EPIDURAL INFUSION (WH - ANES)
14.0000 mL/h | INTRAMUSCULAR | Status: DC | PRN
  Filled 2016-07-21: qty 125

## 2016-07-21 MED ORDER — LACTATED RINGERS IV SOLN
INTRAVENOUS | Status: DC
  Administered 2016-07-21: 02:00:00 via INTRAVENOUS

## 2016-07-21 MED ORDER — DEXTROSE 5 % IV SOLN
5.0000 10*6.[IU] | Freq: Once | INTRAVENOUS | Status: DC
  Filled 2016-07-21: qty 5

## 2016-07-21 MED ORDER — OXYTOCIN 40 UNITS IN LACTATED RINGERS INFUSION - SIMPLE MED
2.5000 [IU]/h | INTRAVENOUS | Status: DC
  Filled 2016-07-21: qty 1000

## 2016-07-21 MED ORDER — IBUPROFEN 100 MG/5ML PO SUSP
600.0000 mg | Freq: Four times a day (QID) | ORAL | Status: DC
  Administered 2016-07-21 – 2016-07-22 (×5): 600 mg via ORAL
  Filled 2016-07-21 (×5): qty 30

## 2016-07-21 MED ORDER — SENNOSIDES-DOCUSATE SODIUM 8.6-50 MG PO TABS
2.0000 | ORAL_TABLET | ORAL | Status: DC
  Administered 2016-07-22: 2 via ORAL
  Filled 2016-07-21: qty 2

## 2016-07-21 MED ORDER — SODIUM CHLORIDE 0.9 % IV SOLN
2.0000 g | Freq: Once | INTRAVENOUS | Status: AC
  Administered 2016-07-21: 2 g via INTRAVENOUS
  Filled 2016-07-21: qty 2000

## 2016-07-21 MED ORDER — DIPHENHYDRAMINE HCL 25 MG PO CAPS: 25.0000 mg | ORAL_CAPSULE | Freq: Four times a day (QID) | ORAL | Status: DC | PRN

## 2016-07-21 MED ORDER — WITCH HAZEL-GLYCERIN EX PADS: 1.0000 "application " | MEDICATED_PAD | CUTANEOUS | Status: DC | PRN

## 2016-07-21 MED ORDER — OXYTOCIN BOLUS FROM INFUSION
500.0000 mL | Freq: Once | INTRAVENOUS | Status: AC
  Administered 2016-07-21: 500 mL via INTRAVENOUS

## 2016-07-21 MED ORDER — ONDANSETRON HCL 4 MG/2ML IJ SOLN: 4.0000 mg | INTRAMUSCULAR | Status: DC | PRN

## 2016-07-21 MED ORDER — ONDANSETRON HCL 4 MG/2ML IJ SOLN: 4.0000 mg | Freq: Four times a day (QID) | INTRAMUSCULAR | Status: DC | PRN

## 2016-07-21 NOTE — Anesthesia Postprocedure Evaluation (Signed)
Anesthesia Post Note  Patient: Tiffany Deleon  Procedure(s) Performed: * No procedures listed *  Patient location during evaluation: L&D Level of consciousness: awake Pain management: pain level not controlled Respiratory status: spontaneous breathing Cardiovascular status: stable Anesthetic complications: no Comments: Pt refused procedure     Last Vitals:  Vitals:   07/21/16 0246  BP: 139/83  Pulse: (!) 106  Resp: 18  Temp: 36.8 C    Last Pain:  Vitals:   07/21/16 0246  TempSrc: Oral  PainSc:    Pain Goal:                 Perris Conwell JR,JOHN Tvisha Schwoerer

## 2016-07-21 NOTE — H&P (Signed)
LABOR AND DELIVERY ADMISSION HISTORY AND PHYSICAL NOTE  Tiffany Deleon is a 20 y.o. female G1P0 with IUP at 8271w3d by 2nd trim US presenting for SROM/active labor.  Patient has been having contractions for the past 2 days, but became stronger this evening. She has been walking all day. While in MAU, had a large gush of fluid, found to have SROM, clear fluid.   She reports positive fetal movement. She denies leakage of fluid or vaginal bleeding.  Prenatal History/Complications:  None  Past Medical History: History reviewed. No pertinent past medical history.  Past Surgical History: History reviewed. No pertinent surgical history.  Obstetrical History: OB History    Gravida Para Term Preterm AB Living   1             SAB TAB Ectopic Multiple Live Births                  Social History: Social History   Social History  . Marital status: Single    Spouse name: N/A  . Number of children: N/A  . Years of education: N/A   Social History Main Topics  . Smoking status: Former Games developermoker  . Smokeless tobacco: Never Used  . Alcohol use No  . Drug use:     Types: Marijuana     Comment: 1 month ago  . Sexual activity: Yes    Birth control/ protection: None   Other Topics Concern  . None   Social History Narrative  . None    Family History: History reviewed. No pertinent family history.  Allergies: No Known Allergies  Prescriptions Prior to Admission  Medication Sig Dispense Refill Last Dose  . Prenatal Vit-Fe Fumarate-FA (MULTIVITAMIN-PRENATAL) 27-0.8 MG TABS tablet Take 1 tablet by mouth daily at 12 noon.   Past Week at Unknown time     Review of Systems   All systems reviewed and negative except as stated in HPI  Last menstrual period 10/09/2015. General appearance: alert, cooperative, appears stated age and no distress Lungs: clear to auscultation bilaterally Heart: regular rate and rhythm Abdomen: soft, non-tender; bowel sounds normal Extremities: No calf  swelling or tenderness Presentation: cephalic Fetal monitoring: 120, mod var, +accels, no decels Uterine activity: q3-315min   Dilation: 9 Effacement (%): 90 Cervical Position: Middle Station: +2 Presentation: Vertex   Prenatal labs: ABO, Rh: --/--/A POS (10/17 0145) Antibody: NEG (10/17 0145) Rubella: !Error! Immune RPR: NON REAC (07/26 1007)  HBsAg: NEGATIVE (04/25 0929)  HIV: NONREACTIVE (07/26 1007)  GBS:    POSITIVE 1 hr Glucola: 80 Genetic screening:  Neg Anatomy US: Normal, girl  Prenatal Transfer Tool  Maternal Diabetes: No Genetic Screening: Normal Maternal Ultrasounds/Referrals: Normal Fetal Ultrasounds or other Referrals:  None Maternal Substance Abuse:  No Significant Maternal Medications:  None Significant Maternal Lab Results: Lab values include: Group B Strep positive  Results for orders placed or performed during the hospital encounter of 07/21/16 (from the past 24 hour(s))  Fern Test   Collection Time: 07/21/16  1:34 AM  Result Value Ref Range   POCT Fern Test Positive = ruptured amniotic membanes   CBC   Collection Time: 07/21/16  1:45 AM  Result Value Ref Range   WBC 15.1 (H) 4.0 - 10.5 K/uL   RBC 3.59 (L) 3.87 - 5.11 MIL/uL   Hemoglobin 11.9 (L) 12.0 - 15.0 g/dL   HCT 16.134.9 (L) 09.636.0 - 04.546.0 %   MCV 97.2 78.0 - 100.0 fL   MCH 33.1 26.0 - 34.0  pg   MCHC 34.1 30.0 - 36.0 g/dL   RDW 96.0 45.4 - 09.8 %   Platelets 269 150 - 400 K/uL  Type and screen [facility]   Collection Time: 07/21/16  1:45 AM  Result Value Ref Range   ABO/RH(D) A POS    Antibody Screen NEG    Sample Expiration 07/24/2016     Patient Active Problem List   Diagnosis Date Noted  . Normal labor 07/21/2016  . GBS (group B Streptococcus carrier), +RV culture, currently pregnant 07/10/2016  . Supervision of normal pregnancy, antepartum 01/28/2016    Assessment: Tiffany Deleon is a 20 y.o. G1P0 at [redacted]w[redacted]d here for SROM/SOL.  #Labor: SROM, SOL #Pain:  Epidural  on request  #FWB:  Cat I #ID:  GBS Pos- Ampicillin #MOF:  Breast #MOC: IUD #Circ:  N/A- girl  Expectant management, advanced labor.  Jen Mow, DO 07/21/2016, 2:41 AM

## 2016-07-21 NOTE — Anesthesia Preprocedure Evaluation (Signed)
Anesthesia Evaluation  Patient identified by MRN, date of birth, ID band Patient awake    Reviewed: Allergy & Precautions, H&P , NPO status , Patient's Chart, lab work & pertinent test results  Airway Mallampati: I  TM Distance: >3 FB Neck ROM: full    Dental no notable dental hx.    Pulmonary neg pulmonary ROS, former smoker,    Pulmonary exam normal        Cardiovascular negative cardio ROS Normal cardiovascular exam     Neuro/Psych negative neurological ROS  negative psych ROS   GI/Hepatic negative GI ROS, Neg liver ROS,   Endo/Other  negative endocrine ROS  Renal/GU negative Renal ROS     Musculoskeletal   Abdominal (+) + obese,   Peds  Hematology negative hematology ROS (+)   Anesthesia Other Findings   Reproductive/Obstetrics (+) Pregnancy                             Anesthesia Physical Anesthesia Plan  ASA: II  Anesthesia Plan: Epidural   Post-op Pain Management:    Induction:   Airway Management Planned:   Additional Equipment:   Intra-op Plan:   Post-operative Plan:   Informed Consent: I have reviewed the patients History and Physical, chart, labs and discussed the procedure including the risks, benefits and alternatives for the proposed anesthesia with the patient or authorized representative who has indicated his/her understanding and acceptance.     Plan Discussed with:   Anesthesia Plan Comments:         Anesthesia Quick Evaluation  

## 2016-07-22 LAB — CBC
HCT: 31.1 % — ABNORMAL LOW (ref 36.0–46.0)
Hemoglobin: 10.8 g/dL — ABNORMAL LOW (ref 12.0–15.0)
MCH: 33.1 pg (ref 26.0–34.0)
MCHC: 34.7 g/dL (ref 30.0–36.0)
MCV: 95.4 fL (ref 78.0–100.0)
PLATELETS: 230 10*3/uL (ref 150–400)
RBC: 3.26 MIL/uL — ABNORMAL LOW (ref 3.87–5.11)
RDW: 13.3 % (ref 11.5–15.5)
WBC: 11.9 10*3/uL — ABNORMAL HIGH (ref 4.0–10.5)

## 2016-07-22 MED ORDER — ACETAMINOPHEN 325 MG PO TABS: 650.0000 mg | ORAL_TABLET | ORAL | 1 refills | Status: DC | PRN

## 2016-07-22 NOTE — Discharge Summary (Signed)
OB Discharge Summary     Patient Name: Tiffany Deleon DOB: 1996/03/08 MRN: [id number]  Date of admission: 07/21/2016 Delivering MD: Levie HeritageSTINSON, JACOB J   Date of discharge: 07/22/2016  Admitting diagnosis: 40 WEEKS LEAKING Intrauterine pregnancy: 261w3d     Secondary diagnosis:  Active Problems:   Normal labor GBS positive  Additional problems: None     Discharge diagnosis: Term Pregnancy Delivered                                                                                                Post partum procedures:None  Augmentation: Pitocin  Complications: None  Hospital course:  Onset of Labor With Vaginal Delivery     20 y.o. yo G1P1001 at 881w3d was admitted in Active Labor on 07/21/2016. Patient had an uncomplicated labor course as follows:  Membrane Rupture Time/Date: 12:30 AM ,07/21/2016   Intrapartum Procedures: Episiotomy: None [1]                                         Lacerations:  None [1]  Patient had a delivery of a Viable infant. 07/21/2016  Information for the patient's newborn:  Tiffany Deleon, Girl Tiffany Deleon [[id number]][[id number]]  Delivery Method: Vaginal, Spontaneous Delivery (Filed from Delivery Summary)   GBS positive, adequately treated.  Pateint had an uncomplicated postpartum course.  She is ambulating, tolerating a regular diet, passing flatus, and urinating well. Patient is discharged home in stable condition on 07/22/16.    Physical exam  Vitals:   07/21/16 0701 07/21/16 1040 07/21/16 1923 07/22/16 0640  BP: 120/65 126/73 127/73 (!) 94/54  Pulse: (!) 102 100 78 67  Resp: 18 16 16 19   Temp: 98.8 F (37.1 C) 98.6 F (37 C) 97.8 F (36.6 C) 98 F (36.7 C)  TempSrc: Oral Oral Oral Oral  SpO2:  100%    Weight:      Height:       General: alert Lochia: appropriate Uterine Fundus: firm Incision: NA DVT Evaluation: No evidence of DVT seen on physical exam. Labs: Lab Results  Component Value Date   WBC 11.9 (H) 07/22/2016   HGB 10.8 (L) 07/22/2016   HCT  31.1 (L) 07/22/2016   MCV 95.4 07/22/2016   PLT 230 07/22/2016   CMP Latest Ref Rng & Units 11/29/2015  Glucose 65 - 99 mg/dL 90  BUN 6 - 20 mg/dL 7  Creatinine 2.950.44 - 2.841.00 mg/dL 1.320.66  Sodium 440135 - 102145 mmol/L 141  Potassium 3.5 - 5.1 mmol/L 3.9  Chloride 101 - 111 mmol/L 109  CO2 22 - 32 mmol/L 22  Calcium 8.9 - 10.3 mg/dL 9.5  Total Protein 6.5 - 8.1 g/dL 7.9  Total Bilirubin 0.3 - 1.2 mg/dL 0.7  Alkaline Phos 38 - 126 U/L 67  AST 15 - 41 U/L 19  ALT 14 - 54 U/L 13(L)    Discharge instruction: per After Visit Summary and "Baby and Me Booklet".  After visit meds:    Medication List    TAKE these  medications   acetaminophen 325 MG tablet Commonly known as:  TYLENOL Take 2 tablets (650 mg total) by mouth every 4 (four) hours as needed (for pain scale < 4).   multivitamin-prenatal 27-0.8 MG Tabs tablet Take 1 tablet by mouth daily at 12 noon.       Diet: routine diet  Activity: Advance as tolerated. Pelvic rest for 6 weeks.   Outpatient follow up:2 weeks Follow up Appt: Future Appointments Date Time Provider Department Center  09/01/2016 1:40 PM Donette Larry, CNM [facility]   Follow up Visit:No Follow-up on file.  Postpartum contraception: IUD Mirena  Newborn Data: Live born female  Birth Weight: 6 lb 11.8 oz (3055 g) APGAR: 8, 9  Baby Feeding: Bottle Disposition:home with mother   07/22/2016 Deniece Ree, MD  OB FELLOW DISCHARGE ATTESTATION  I have seen and examined this patient and agree with above documentation in the resident's note.   Silvano Bilis, MD 2:20 PM

## 2016-07-22 NOTE — Discharge Instructions (Signed)

## 2016-07-22 NOTE — Clinical Social Work Maternal (Signed)
  CLINICAL SOCIAL WORK MATERNAL/CHILD NOTE  Patient Details  Name: Tiffany Deleon MRN: [id number] Date of Birth: 02-26-1996  Date:  07/22/2016  Clinical Social Worker Initiating Note:  Laurey Arrow Date/ Time Initiated:  07/22/16/1155     Child's Name:  Tiffany Deleon   Legal Guardian:  Mother   Need for Interpreter:  None   Date of Referral:  07/22/16     Reason for Referral:  Current Substance Use/Substance Use During Pregnancy    Referral Source:  Central Nursery   Address:  [address] [place] [address]  Phone number:  [phone]   Household Members:  Self, Significant Other, Parents   Natural Supports (not living in the home):  Extended Family, Immediate Family, Spouse/significant other, Parent   Professional Supports: None   Employment: Animator, Retail buyer   Type of Work:     Education:  Database administrator Resources:  [employer]   Other Resources:  [employer], Physicist, medical    Cultural/Religious Considerations Which May Impact Care:  None Report  Strengths:  Ability to meet basic needs , Engineer, materials , Home prepared for child    Risk Factors/Current Problems:  Substance Use    Cognitive State:  Alert , Able to Concentrate , Insightful    Mood/Affect:  Happy , Bright , Interested , Relaxed    CSW Assessment:CSW met with MOB to complete an assessment for hx of THC during pregnancy.  MOB was polite, inviting, and interested in meeting with CSW.  CSW inquired about MOB's SA use and MOB reported the use of marijuana throughout pregnancy.  MOB stated that MOB's last use was about a month ago and communicated that MOB used marijuana in effort to decrease MOB's nausea. MOB informed CSW that MOB's doctor was aware of MOB's nausea and substance use. CSW thanked MOB for [employer] and informed MOB's of the hospital's policy and procedures regarding substance use in pregnancy. MOB was informed of the 2 screenings for the infant.  MOB  was understanding and had no concerns.  MOB also declined resources and interventions for substance abuse counseling. CSW explained to MOB that the infant's UDS was negative and CSW will continue to monitor the infant's cord. CSW made MOB aware that if infant's Cord Screen is positive without an explanation, CSW will make a report to [facility] CPS. MOB provided CSW and updated contact number [phone], and informed CSW that it is likely that Amber will be contacted. CSW educated MOB about SIDS.  CSW thanked MOB for meeting with CSW and encouraged MOB to contact CSW if MOB had any additional questions or concerns.     CSW Plan/Description:  Patient/Family Education , No Further Intervention Required/No Barriers to Discharge, Information/Referral to [employer]  (CSW will follow infant's cord and will make a report to [place] if needed. )   Laurey Arrow, MSW, LCSW Clinical Social Work [phone]   Dimple Nanas, LCSW 07/22/2016, 12:01 PM

## 2016-07-22 NOTE — Plan of Care (Signed)
Problem: Health Behavior/Discharge Planning: Goal: Ability to manage health-related needs will improve Outcome: Completed/Met Date Met: 07/22/16 Discharge teaching and paperwork discussed. Reasons to call the MD reviewed.  Pt verbalizes understanding and denies questions.

## 2016-07-22 NOTE — Plan of Care (Signed)
Problem: Activity: Goal: Ability to tolerate increased activity will improve Outcome: Completed/Met Date Met: 07/22/16 Pt ambulating frequently in the room.  Encouraged to walk in the [place].  Problem: Nutritional: Goal: Mother's verbalization of comfort with breastfeeding process will improve Outcome: Completed/Met Date Met: 07/22/16 Pt bottle feeding.  Breast care for a non-breastfeeding mom discussed. Pt verbalized understanding.

## 2016-08-05 ENCOUNTER — Encounter: Payer: Self-pay | Admitting: Obstetrics & Gynecology

## 2016-08-12 ENCOUNTER — Encounter: Payer: Medicaid Other | Admitting: Obstetrics and Gynecology

## 2016-09-01 ENCOUNTER — Ambulatory Visit: Payer: Medicaid Other | Admitting: Certified Nurse Midwife

## 2016-09-24 ENCOUNTER — Ambulatory Visit: Payer: Medicaid Other | Admitting: Certified Nurse Midwife

## 2016-10-05 NOTE — L&D Delivery Note (Signed)
Patient is 21 y.o. [id number] 5543w1d admitted for PTL.   Delivery Note Upon arrival to room patient was complete with bulging bag for ~5hrs. Decision was made to AROM and deliver [name]. Head delivered LOA. No nuchal cord present. Shoulder and body delivered in usual fashion. Infant with spontaneous cry, placed on mother's abdomen, dried and bulb suctioned. Cord clamped x 2 after 1-minute delay, and cut. Cord blood drawn. About 30 minutes between delivery of [name] that patient started feeling pressure again to push. Bulging sac of [name] ruptured with footling presentation. Patient pushed with good effort to deliver [name] in breech presentation without complication. Placenta delivered spontaneously with [name]. Placentas were fused. Fundus firm with massage and Pitocin. Perineum inspected and found to have no lacerations, with good hemostasis achieved.     [name], Tiffany Deleon [[id number]][[id number]]  At 6:36 AM a viable female was delivered via Vaginal, Spontaneous (Presentation: LOA ).  APGAR: 8, 9; weight 1480g, 3lb 4.2oz.   Placenta status: Intact, fused.  Cord: 3V  with the following complications: None.  Anesthesia: Epidural   Episiotomy: None Lacerations: None Est. Blood Loss (mL): 300    Tiffany Deleon, Tiffany Deleon [[id number]][[id number]]  At 7:09 AM a viable female was delivered via Vaginal, Spontaneous (Presentation: ;  ).  APGAR: , ; weight  .   Placenta status: Intact, fused.  Cord: 3V with the following complications: None.  Anesthesia:  Epidural Episiotomy: None Lacerations: None Est. Blood Loss (mL): 300  Mom to postpartum.   Baby A to NICU.   Baby B to NICU.  Dr. Alysia PennaErvin attending present throughout entire delivery.   Tiffany AdaJazma Phelps, DO 09/07/2017, 7:24 AM

## 2016-10-09 ENCOUNTER — Encounter: Payer: Self-pay | Admitting: Family Medicine

## 2016-10-09 ENCOUNTER — Ambulatory Visit (INDEPENDENT_AMBULATORY_CARE_PROVIDER_SITE_OTHER): Payer: Medicaid Other | Admitting: Family Medicine

## 2016-10-09 DIAGNOSIS — Z3202 Encounter for pregnancy test, result negative: Secondary | ICD-10-CM | POA: Diagnosis present

## 2016-10-09 LAB — POCT PREGNANCY, URINE: PREG TEST UR: NEGATIVE

## 2016-10-09 NOTE — Progress Notes (Signed)
Subjective:     Tiffany Deleon is a 21 y.o. female who presents for a postpartum visit. She is several weeks postpartum following a spontaneous vaginal delivery. I have fully reviewed the prenatal and intrapartum course. The delivery was at 39 gestational weeks. Outcome: spontaneous vaginal delivery. Anesthesia: none. Postpartum course has been normal. Baby's course has been normal. Baby is feeding by bottle - Similac Advance. Bleeding no bleeding. Bowel function is normal. Bladder function is normal. Patient is sexually active. Contraception method is none. Postpartum depression screening: negative.  The following portions of the patient's history were reviewed and updated as appropriate: allergies, current medications, past family history, past medical history, past social history, past surgical history and problem list.  Review of Systems Pertinent items are noted in HPI.   Objective:    There were no vitals taken for this visit.  General:  alert, cooperative and no distress  Lungs: clear to auscultation bilaterally  Heart:  regular rate and rhythm, S1, S2 normal, no murmur, click, rub or gallop  Abdomen: soft, non-tender; bowel sounds normal; no masses,  no organomegaly        Assessment:     Normal postpartum exam. Pap smear not done at today's visit.   Plan:    1. Contraception: IUD - will schedule in 2 weeks. Discussed no unprotected sex for 2 weeks.

## 2016-10-26 ENCOUNTER — Ambulatory Visit: Payer: Medicaid Other | Admitting: Advanced Practice Midwife

## 2016-11-23 ENCOUNTER — Telehealth: Payer: Self-pay | Admitting: *Deleted

## 2016-11-23 NOTE — Telephone Encounter (Signed)
Patient left message on nurse voicemail on 11/20/16 at 1123.  States she delivered her daughter about 3 months ago.  States she is now having her period and is passing clots.  Wants to know if this is normal.  Requests a return call to [phone].

## 2016-11-26 NOTE — Telephone Encounter (Signed)
I called Tiffany Deleon and spoke with a female who said [name] isn't there. I asked her to please let Tiffany Deleon know we were returning her phone call and to call us back.

## 2016-11-30 NOTE — Telephone Encounter (Signed)
I called Tiffany Deleon and she states "  You waited too long , everything is fine now". I explained to her we did call her back but left a message with someone.  I verified she did not have a need and she verified she is fine now.

## 2017-03-16 ENCOUNTER — Encounter (HOSPITAL_BASED_OUTPATIENT_CLINIC_OR_DEPARTMENT_OTHER): Payer: Self-pay

## 2017-03-16 ENCOUNTER — Emergency Department (HOSPITAL_BASED_OUTPATIENT_CLINIC_OR_DEPARTMENT_OTHER)
Admission: EM | Admit: 2017-03-16 | Discharge: 2017-03-16 | Disposition: A | Discharge status: Home / Self Care | Payer: Medicaid Other | Attending: Emergency Medicine | Admitting: Emergency Medicine

## 2017-03-16 DIAGNOSIS — O210 Mild hyperemesis gravidarum: Secondary | ICD-10-CM

## 2017-03-16 DIAGNOSIS — R1110 Vomiting, unspecified: Secondary | ICD-10-CM | POA: Diagnosis present

## 2017-03-16 DIAGNOSIS — Z3490 Encounter for supervision of normal pregnancy, unspecified, unspecified trimester: Secondary | ICD-10-CM

## 2017-03-16 DIAGNOSIS — Z87891 Personal history of nicotine dependence: Secondary | ICD-10-CM | POA: Insufficient documentation

## 2017-03-16 DIAGNOSIS — O219 Vomiting of pregnancy, unspecified: Secondary | ICD-10-CM | POA: Diagnosis not present

## 2017-03-16 DIAGNOSIS — Z3A00 Weeks of gestation of pregnancy not specified: Secondary | ICD-10-CM | POA: Diagnosis not present

## 2017-03-16 DIAGNOSIS — Z3201 Encounter for pregnancy test, result positive: Secondary | ICD-10-CM | POA: Diagnosis not present

## 2017-03-16 LAB — CBC WITH DIFFERENTIAL/PLATELET
BASOS ABS: 0 10*3/uL (ref 0.0–0.1)
BASOS PCT: 0 %
EOS ABS: 0.2 10*3/uL (ref 0.0–0.7)
EOS PCT: 1 %
HCT: 33.8 % — ABNORMAL LOW (ref 36.0–46.0)
HEMOGLOBIN: 11.6 g/dL — AB (ref 12.0–15.0)
Lymphocytes Relative: 21 %
Lymphs Abs: 3.1 10*3/uL (ref 0.7–4.0)
MCH: 32.5 pg (ref 26.0–34.0)
MCHC: 34.3 g/dL (ref 30.0–36.0)
MCV: 94.7 fL (ref 78.0–100.0)
Monocytes Absolute: 0.9 10*3/uL (ref 0.1–1.0)
Monocytes Relative: 6 %
NEUTROS PCT: 72 %
Neutro Abs: 10.6 10*3/uL — ABNORMAL HIGH (ref 1.7–7.7)
PLATELETS: 311 10*3/uL (ref 150–400)
RBC: 3.57 MIL/uL — AB (ref 3.87–5.11)
RDW: 13.1 % (ref 11.5–15.5)
WBC: 14.9 10*3/uL — AB (ref 4.0–10.5)

## 2017-03-16 LAB — COMPREHENSIVE METABOLIC PANEL
ALT: 11 U/L — AB (ref 14–54)
AST: 19 U/L (ref 15–41)
Albumin: 3.7 g/dL (ref 3.5–5.0)
Alkaline Phosphatase: 66 U/L (ref 38–126)
Anion gap: 7 (ref 5–15)
BILIRUBIN TOTAL: 0.4 mg/dL (ref 0.3–1.2)
BUN: 8 mg/dL (ref 6–20)
CALCIUM: 9.5 mg/dL (ref 8.9–10.3)
CO2: 23 mmol/L (ref 22–32)
CREATININE: 0.64 mg/dL (ref 0.44–1.00)
Chloride: 106 mmol/L (ref 101–111)
Glucose, Bld: 107 mg/dL — ABNORMAL HIGH (ref 65–99)
Potassium: 3.9 mmol/L (ref 3.5–5.1)
Sodium: 136 mmol/L (ref 135–145)
TOTAL PROTEIN: 7.1 g/dL (ref 6.5–8.1)

## 2017-03-16 LAB — URINALYSIS, ROUTINE W REFLEX MICROSCOPIC
Bilirubin Urine: NEGATIVE
Glucose, UA: NEGATIVE mg/dL
Hgb urine dipstick: NEGATIVE
KETONES UR: NEGATIVE mg/dL
NITRITE: NEGATIVE
PROTEIN: NEGATIVE mg/dL
Specific Gravity, Urine: 1.014 (ref 1.005–1.030)
pH: 6 (ref 5.0–8.0)

## 2017-03-16 LAB — URINALYSIS, MICROSCOPIC (REFLEX): RBC / HPF: NONE SEEN RBC/hpf (ref 0–5)

## 2017-03-16 LAB — PREGNANCY, URINE: PREG TEST UR: POSITIVE — AB

## 2017-03-16 LAB — HCG, QUANTITATIVE, PREGNANCY: hCG, Beta Chain, Quant, S: 148730 m[IU]/mL — ABNORMAL HIGH

## 2017-03-16 MED ORDER — SODIUM CHLORIDE 0.9 % IV BOLUS (SEPSIS)
1000.0000 mL | Freq: Once | INTRAVENOUS | Status: AC
  Administered 2017-03-16: 1000 mL via INTRAVENOUS

## 2017-03-16 MED ORDER — DOXYLAMINE SUCCINATE (SLEEP) 25 MG PO TABS: 25.0000 mg | ORAL_TABLET | Freq: Three times a day (TID) | ORAL | 0 refills | Status: DC | PRN

## 2017-03-16 MED ORDER — VITAMIN B-6 25 MG PO TABS: 25.0000 mg | ORAL_TABLET | Freq: Three times a day (TID) | ORAL | 0 refills | Status: DC | PRN

## 2017-03-16 NOTE — ED Notes (Signed)
ED Provider at bedside. 

## 2017-03-16 NOTE — ED Provider Notes (Signed)
[facility] Provider Note   CSN: [id number] Arrival date & time: 03/16/17  1914  By signing my name below, I, Tiffany Deleon, attest that this documentation has been prepared under the direction and in the presence of non-physician practitioner, Tiffany Safe, PA-C. Electronically Signed: Modena Deleon, Scribe. 03/16/2017. 8:14 PM.  History   Chief Complaint Chief Complaint  Patient presents with  . Morning Sickness   The history is provided by the patient. No language interpreter was used.   HPI Comments: Tiffany Deleon is a 21 y.o. female who presents to the Emergency Department complaining of intermittent vomiting that started about 3 days ago.  She suspects she may be pregnant but has not taken a test.  She states her symptoms are worse in the morning and feel similar to her last pregnancy. She is almost 8 months postpartum.  She reports associated chills, nausea, and diarrhea. Her LMNP was 2 months ago. Denies any fever, abdominal pain, vaginal bleeding/discharge, pelvic pain, or other complaints at this time.   [facility]   History reviewed. No pertinent past medical history.  There are no active problems to display for this patient.   History reviewed. No pertinent surgical history.  OB History    Gravida Para Term Preterm AB Living   1 1 1     1    SAB TAB Ectopic Multiple Live Births         0 1       Home Medications    Prior to Admission medications   Medication Sig Start Date End Date Taking? Authorizing Provider  doxylamine, Sleep, (UNISOM) 25 MG tablet Take 1 tablet (25 mg total) by mouth every 8 (eight) hours as needed. 03/16/17   Tiffany Gong, PA-C  vitamin B-6 (PYRIDOXINE) 25 MG tablet Take 1 tablet (25 mg total) by mouth every 8 (eight) hours as needed. 03/16/17   Tiffany Gong, PA-C    Family History No family history on file.  Social History Social History  Substance Use Topics  . Smoking status: Former  Games developer  . Smokeless tobacco: Never Used  . Alcohol use No     Allergies   Patient has no known allergies.   Review of Systems Review of Systems  Constitutional: Positive for appetite change and chills. Negative for fatigue and fever.  HENT: Negative for congestion, drooling and sore throat.   Eyes: Negative for visual disturbance.  Respiratory: Negative for cough, chest tightness and shortness of breath.   Cardiovascular: Negative for chest pain.  Gastrointestinal: Positive for diarrhea (one loose stool), nausea and vomiting. Negative for abdominal pain.  Genitourinary: Negative for dysuria, pelvic pain, vaginal bleeding and vaginal discharge.  Musculoskeletal: Positive for arthralgias. Negative for back pain, myalgias and neck pain.  Skin: Negative for pallor and wound.  Neurological: Negative for syncope, weakness, light-headedness and headaches.     Physical Exam Updated Vital Signs BP 101/80 (BP Location: Right Arm)   Pulse 76   Temp 98.6 F (37 C) (Oral)   Resp 18   Ht 5' 5.5" (1.664 m)   Wt 177 lb 5 oz (80.4 kg)   LMP 02/11/2017   SpO2 99%   Breastfeeding? No   BMI 29.06 kg/m   Physical Exam  Constitutional: She appears well-developed and well-nourished. No distress.  HENT:  Head: Normocephalic and atraumatic.  Eyes: Conjunctivae are normal.  Neck: Neck supple.  Cardiovascular: Normal rate and regular rhythm.   No murmur heard. Pulmonary/Chest: Effort normal and breath  sounds normal. No respiratory distress. She has no wheezes.  Abdominal: Soft. Bowel sounds are normal. She exhibits no distension and no mass. There is no tenderness.  Abdomen is not obviously gravid, uterus is not palpable over pelvic brim  Musculoskeletal: Normal range of motion.  Neurological: She is alert. No sensory deficit. She exhibits normal muscle tone.  Skin: Skin is warm and dry. No rash noted.  Psychiatric: She has a normal mood and affect.  Nursing note and vitals  reviewed.    ED Treatments / Results  DIAGNOSTIC STUDIES: Oxygen Saturation is 99% on RA, normal by my interpretation.    COORDINATION OF CARE: 8:18 PM- Pt advised of plan for treatment and pt agrees.  Labs (all labs ordered are listed, but only abnormal results are displayed) Labs Reviewed  URINALYSIS, ROUTINE W REFLEX MICROSCOPIC - Abnormal; Notable for the following:       Result Value   APPearance CLOUDY (*)    Leukocytes, UA SMALL (*)    All other components within normal limits  PREGNANCY, URINE - Abnormal; Notable for the following:    Preg Test, Ur POSITIVE (*)    All other components within normal limits  URINALYSIS, MICROSCOPIC (REFLEX) - Abnormal; Notable for the following:    Bacteria, UA FEW (*)    Squamous Epithelial / LPF 6-30 (*)    All other components within normal limits  HCG, QUANTITATIVE, PREGNANCY - Abnormal; Notable for the following:    hCG, Beta Chain, Quant, S 148,730 (*)    All other components within normal limits  COMPREHENSIVE METABOLIC PANEL - Abnormal; Notable for the following:    Glucose, Bld 107 (*)    ALT 11 (*)    All other components within normal limits  CBC WITH DIFFERENTIAL/PLATELET - Abnormal; Notable for the following:    WBC 14.9 (*)    RBC 3.57 (*)    Hemoglobin 11.6 (*)    HCT 33.8 (*)    Neutro Abs 10.6 (*)    All other components within normal limits    EKG  EKG Interpretation None       Radiology No results found.  Procedures Procedures (including critical care time)  Medications Ordered in ED Medications  sodium chloride 0.9 % bolus 1,000 mL (0 mLs Intravenous Stopped 03/16/17 2123)     Initial Impression / Assessment and Plan / ED Course  I have reviewed the triage vital signs and the nursing notes.  Pertinent labs & imaging results that were available during my care of the patient were reviewed by me and considered in my medical decision making (see chart for details).    Tiffany Deleon presents with  symptoms consistent with morning sickness in pregnancy.  She was treated in the ED with fluids which improved her symptoms.  She did not want nausea medication in the ED.  She was given instructions not to smoke or drink while pregnant, to start taking a daily prenatal vitamin and follow up with the women's clinic.  She will be given RX for Vitamin B6 and unisom (doxylamine) for nausea.  Instructed on supportive OTC care.  She was informed that all medications during pregnancy have potential risks and stated her understanding.  Suspect leukocytosis secondary to increased stress and morning sickness/vomiting.  UA with out clear evidence of infection.  Patient was given the option to ask questions, all of which were answered to the best of my ability.  Patient was given return precautions and stated her understanding.  Final Clinical Impressions(s) / ED Diagnoses   Final diagnoses:  Pregnancy, unspecified gestational age  Morning sickness    New Prescriptions Discharge Medication List as of 03/16/2017 10:32 PM    START taking these medications   Details  doxylamine, Sleep, (UNISOM) 25 MG tablet Take 1 tablet (25 mg total) by mouth every 8 (eight) hours as needed., Starting Tue 03/16/2017, Print    vitamin B-6 (PYRIDOXINE) 25 MG tablet Take 1 tablet (25 mg total) by mouth every 8 (eight) hours as needed., Starting Tue 03/16/2017, Print       I personally performed the services described in this documentation, which was scribed in my presence. The recorded information has been reviewed and is accurate.     Tiffany GongHammond, Jammi Morrissette W, PA-C 03/17/17 1611    Shaune PollackIsaacs, Cameron, MD 03/18/17 [phone]

## 2017-03-16 NOTE — ED Triage Notes (Signed)
C/o n/v x 3 days-diarrhea x today-NAD-steady gait

## 2017-03-16 NOTE — Discharge Instructions (Signed)
Your blood tests show that you are most likely at least [redacted] weeks pregnant. Please do not smoke, or drink alcohol while pregnant. Please schedule a follow-up appointment with the woman's clinic for her prenatal care. If your symptoms worsen please feel free to return to the emergency room for evaluation.

## 2017-03-18 ENCOUNTER — Ambulatory Visit: Payer: Medicaid Other

## 2017-05-31 LAB — GLUCOSE TOLERANCE, 1 HOUR: Glucose 1 Hour: 104

## 2017-05-31 LAB — OB RESULTS CONSOLE VARICELLA ZOSTER ANTIBODY, IGG: Varicella: NON-IMMUNE/NOT IMMUNE

## 2017-05-31 LAB — OB RESULTS CONSOLE HGB/HCT, BLOOD
HEMATOCRIT: 32
Hemoglobin: 10.7

## 2017-05-31 LAB — OB RESULTS CONSOLE HIV ANTIBODY (ROUTINE TESTING): HIV: NONREACTIVE

## 2017-05-31 LAB — SICKLE CELL SCREEN: Sickle Cell Screen: NORMAL

## 2017-05-31 LAB — OB RESULTS CONSOLE GC/CHLAMYDIA
CHLAMYDIA, DNA PROBE: NEGATIVE
Gonorrhea: NEGATIVE

## 2017-05-31 LAB — OB RESULTS CONSOLE PLATELET COUNT: PLATELETS: 288

## 2017-05-31 LAB — OB RESULTS CONSOLE HEPATITIS B SURFACE ANTIGEN: Hepatitis B Surface Ag: NEGATIVE

## 2017-05-31 LAB — CYSTIC FIBROSIS DIAGNOSTIC STUDY: INTERPRETATION-CFDNA: NEGATIVE

## 2017-05-31 LAB — CYTOLOGY - PAP: Pap: NEGATIVE

## 2017-05-31 LAB — OB RESULTS CONSOLE RPR: RPR: NONREACTIVE

## 2017-05-31 LAB — OB RESULTS CONSOLE RUBELLA ANTIBODY, IGM: Rubella: IMMUNE

## 2017-05-31 LAB — CULTURE, OB URINE: Urine Culture, OB: NEGATIVE

## 2017-07-13 ENCOUNTER — Encounter: Payer: Self-pay | Admitting: *Deleted

## 2017-07-14 ENCOUNTER — Encounter: Payer: Self-pay | Admitting: Obstetrics and Gynecology

## 2017-07-14 ENCOUNTER — Ambulatory Visit (INDEPENDENT_AMBULATORY_CARE_PROVIDER_SITE_OTHER): Payer: Medicaid Other | Admitting: Obstetrics and Gynecology

## 2017-07-14 DIAGNOSIS — O0992 Supervision of high risk pregnancy, unspecified, second trimester: Secondary | ICD-10-CM

## 2017-07-14 DIAGNOSIS — O30042 Twin pregnancy, dichorionic/diamniotic, second trimester: Secondary | ICD-10-CM | POA: Diagnosis not present

## 2017-07-14 DIAGNOSIS — O0990 Supervision of high risk pregnancy, unspecified, unspecified trimester: Secondary | ICD-10-CM

## 2017-07-14 DIAGNOSIS — O30009 Twin pregnancy, unspecified number of placenta and unspecified number of amniotic sacs, unspecified trimester: Secondary | ICD-10-CM | POA: Insufficient documentation

## 2017-07-14 DIAGNOSIS — O09899 Supervision of other high risk pregnancies, unspecified trimester: Secondary | ICD-10-CM

## 2017-07-14 DIAGNOSIS — Z8619 Personal history of other infectious and parasitic diseases: Secondary | ICD-10-CM | POA: Insufficient documentation

## 2017-07-14 LAB — POCT URINALYSIS DIP (DEVICE)
GLUCOSE, UA: NEGATIVE mg/dL
Hgb urine dipstick: NEGATIVE
Ketones, ur: 15 mg/dL — AB
NITRITE: NEGATIVE
PROTEIN: NEGATIVE mg/dL
UROBILINOGEN UA: 1 mg/dL (ref 0.0–1.0)
pH: 6 (ref 5.0–8.0)

## 2017-07-14 MED ORDER — ASPIRIN EC 81 MG PO TBEC: 81.0000 mg | DELAYED_RELEASE_TABLET | Freq: Every day | ORAL | 2 refills | Status: DC

## 2017-07-14 NOTE — Progress Notes (Signed)
Subjective:  Tiffany Deleon is a 21 y.o. G2P1001 at [redacted]w[redacted]d being seen today for ongoing prenatal care. Transfering from [facility] d/t twin gestation. Di/Di bu U/S. H/O GBS with last pregnancy. H/O marijuana use.  She is currently monitored for the following issues for this high-risk pregnancy and has Supervision of high risk pregnancy, antepartum; Pregnancy, twins; Short interval between pregnancies affecting pregnancy, antepartum; and History of group B Streptococcus (GBS) infection on her problem list.  Patient reports no complaints.  Contractions: Not present. Vag. Bleeding: None.  Movement: Present. Denies leaking of fluid.   The following portions of the patient's history were reviewed and updated as appropriate: allergies, current medications, past family history, past medical history, past social history, past surgical history and problem list. Problem list updated.  Objective:   Vitals:   07/14/17 0836  BP: (!) 106/49  Pulse: 89  Weight: 182 lb 8 oz (82.8 kg)    Fetal Status: Fetal Heart Rate (bpm): 156/150   Movement: Present     General:  Alert, oriented and cooperative. Patient is in no acute distress.  Skin: Skin is warm and dry. No rash noted.   Cardiovascular: Normal heart rate noted  Respiratory: Normal respiratory effort, no problems with respiration noted  Abdomen: Soft, gravid, appropriate for gestational age. Pain/Pressure: Absent     Pelvic:  Cervical exam deferred        Extremities: Normal range of motion.  Edema: None  Mental Status: Normal mood and affect. Normal behavior. Normal judgment and thought content.   Urinalysis:      Assessment and Plan:  Pregnancy: G2P1001 at [redacted]w[redacted]d  1. Supervision of high risk pregnancy, antepartum Stable  2. Dichorionic diamniotic twin pregnancy in second trimester Twin pregnancy reviewed with pt. Will check growth U/S. - aspirin EC 81 MG tablet; Take 1 tablet (81 mg total) by mouth daily. Take after 12 weeks for prevention of  preeclampsia later in pregnancy  Dispense: 300 tablet; Refill: 2  3. Short interval between pregnancies affecting pregnancy, antepartum   4. History of group B Streptococcus (GBS) infection Tx while in labor  Preterm labor symptoms and general obstetric precautions including but not limited to vaginal bleeding, contractions, leaking of fluid and fetal movement were reviewed in detail with the patient. Please refer to After Visit Summary for other counseling recommendations.  Return in about 4 weeks (around 08/11/2017) for OB visit.   Hermina Staggers, MD

## 2017-07-14 NOTE — Patient Instructions (Signed)

## 2017-07-14 NOTE — Progress Notes (Signed)
New OB  Packet been given to patient

## 2017-07-14 NOTE — Progress Notes (Signed)
OB US scheduled for October 17th @ 0845.  Pt notified.

## 2017-07-14 NOTE — Addendum Note (Signed)
Addended by: Faythe Casa on: 07/14/2017 09:29 AM   Modules accepted: Orders

## 2017-07-21 ENCOUNTER — Ambulatory Visit (HOSPITAL_COMMUNITY): Admission: RE | Admit: 2017-07-21 | Payer: Medicaid Other | Source: Ambulatory Visit

## 2017-07-23 ENCOUNTER — Encounter: Payer: Self-pay | Admitting: *Deleted

## 2017-07-26 ENCOUNTER — Other Ambulatory Visit: Payer: Self-pay | Admitting: Obstetrics and Gynecology

## 2017-07-26 ENCOUNTER — Ambulatory Visit (HOSPITAL_COMMUNITY)
Admission: RE | Admit: 2017-07-26 | Discharge: 2017-07-26 | Disposition: A | Discharge status: Home / Self Care | Payer: Medicaid Other | Source: Ambulatory Visit | Attending: Obstetrics and Gynecology | Admitting: Obstetrics and Gynecology

## 2017-07-26 ENCOUNTER — Encounter (HOSPITAL_COMMUNITY): Payer: Self-pay

## 2017-07-26 DIAGNOSIS — Z3A23 23 weeks gestation of pregnancy: Secondary | ICD-10-CM

## 2017-07-26 DIAGNOSIS — O30042 Twin pregnancy, dichorionic/diamniotic, second trimester: Secondary | ICD-10-CM

## 2017-07-26 DIAGNOSIS — O09892 Supervision of other high risk pregnancies, second trimester: Secondary | ICD-10-CM

## 2017-07-26 DIAGNOSIS — Z3689 Encounter for other specified antenatal screening: Secondary | ICD-10-CM

## 2017-07-26 DIAGNOSIS — O26872 Cervical shortening, second trimester: Secondary | ICD-10-CM

## 2017-07-26 DIAGNOSIS — Z3A25 25 weeks gestation of pregnancy: Secondary | ICD-10-CM | POA: Diagnosis not present

## 2017-07-27 ENCOUNTER — Other Ambulatory Visit (HOSPITAL_COMMUNITY): Payer: Self-pay | Admitting: *Deleted

## 2017-07-27 DIAGNOSIS — O30049 Twin pregnancy, dichorionic/diamniotic, unspecified trimester: Secondary | ICD-10-CM

## 2017-07-27 DIAGNOSIS — O30042 Twin pregnancy, dichorionic/diamniotic, second trimester: Secondary | ICD-10-CM

## 2017-08-02 ENCOUNTER — Ambulatory Visit (HOSPITAL_COMMUNITY): Admission: RE | Admit: 2017-08-02 | Payer: Medicaid Other | Source: Ambulatory Visit

## 2017-08-06 ENCOUNTER — Ambulatory Visit (HOSPITAL_COMMUNITY)
Admission: RE | Admit: 2017-08-06 | Discharge: 2017-08-06 | Disposition: A | Discharge status: Home / Self Care | Payer: Medicaid Other | Source: Ambulatory Visit | Attending: Obstetrics and Gynecology | Admitting: Obstetrics and Gynecology

## 2017-08-06 ENCOUNTER — Encounter (HOSPITAL_COMMUNITY): Payer: Self-pay

## 2017-08-06 DIAGNOSIS — O26872 Cervical shortening, second trimester: Secondary | ICD-10-CM | POA: Diagnosis not present

## 2017-08-06 DIAGNOSIS — O09892 Supervision of other high risk pregnancies, second trimester: Secondary | ICD-10-CM | POA: Diagnosis not present

## 2017-08-06 DIAGNOSIS — Z3A26 26 weeks gestation of pregnancy: Secondary | ICD-10-CM | POA: Diagnosis not present

## 2017-08-06 DIAGNOSIS — O30042 Twin pregnancy, dichorionic/diamniotic, second trimester: Secondary | ICD-10-CM | POA: Diagnosis present

## 2017-08-06 DIAGNOSIS — O30049 Twin pregnancy, dichorionic/diamniotic, unspecified trimester: Secondary | ICD-10-CM

## 2017-08-12 ENCOUNTER — Encounter: Payer: Medicaid Other | Admitting: Obstetrics and Gynecology

## 2017-08-13 ENCOUNTER — Telehealth: Payer: Self-pay | Admitting: General Practice

## 2017-08-13 NOTE — Telephone Encounter (Signed)
Patient missed appointment on 08/12/17.  Rescheduled patient for 08/24/17 at 10:00am.  Mailed patient appointment reminder.

## 2017-08-23 ENCOUNTER — Other Ambulatory Visit (HOSPITAL_COMMUNITY): Payer: Self-pay | Admitting: Maternal and Fetal Medicine

## 2017-08-23 ENCOUNTER — Encounter (HOSPITAL_COMMUNITY): Payer: Self-pay

## 2017-08-23 ENCOUNTER — Other Ambulatory Visit (HOSPITAL_COMMUNITY): Payer: Self-pay | Admitting: *Deleted

## 2017-08-23 ENCOUNTER — Ambulatory Visit (HOSPITAL_COMMUNITY)
Admission: RE | Admit: 2017-08-23 | Discharge: 2017-08-23 | Disposition: A | Discharge status: Home / Self Care | Payer: Medicaid Other | Source: Ambulatory Visit | Attending: Obstetrics and Gynecology | Admitting: Obstetrics and Gynecology

## 2017-08-23 DIAGNOSIS — O26873 Cervical shortening, third trimester: Secondary | ICD-10-CM

## 2017-08-23 DIAGNOSIS — Z362 Encounter for other antenatal screening follow-up: Secondary | ICD-10-CM | POA: Insufficient documentation

## 2017-08-23 DIAGNOSIS — O09893 Supervision of other high risk pregnancies, third trimester: Secondary | ICD-10-CM | POA: Insufficient documentation

## 2017-08-23 DIAGNOSIS — O321XX0 Maternal care for breech presentation, not applicable or unspecified: Secondary | ICD-10-CM | POA: Diagnosis not present

## 2017-08-23 DIAGNOSIS — O30042 Twin pregnancy, dichorionic/diamniotic, second trimester: Secondary | ICD-10-CM

## 2017-08-23 DIAGNOSIS — Z3A29 29 weeks gestation of pregnancy: Secondary | ICD-10-CM

## 2017-08-23 DIAGNOSIS — O30043 Twin pregnancy, dichorionic/diamniotic, third trimester: Secondary | ICD-10-CM | POA: Diagnosis present

## 2017-08-23 DIAGNOSIS — O358XX1 Maternal care for other (suspected) fetal abnormality and damage, fetus 1: Secondary | ICD-10-CM | POA: Insufficient documentation

## 2017-08-24 ENCOUNTER — Encounter: Payer: Medicaid Other | Admitting: Family Medicine

## 2017-09-06 ENCOUNTER — Encounter (HOSPITAL_COMMUNITY): Payer: Self-pay | Admitting: *Deleted

## 2017-09-06 ENCOUNTER — Inpatient Hospital Stay (HOSPITAL_COMMUNITY)
Admission: AD | Admit: 2017-09-06 | Discharge: 2017-09-09 | DRG: 805 | Disposition: A | Discharge status: Home / Self Care | Payer: Medicaid Other | Source: Ambulatory Visit | Attending: Obstetrics & Gynecology | Admitting: Obstetrics & Gynecology

## 2017-09-06 DIAGNOSIS — Z3A31 31 weeks gestation of pregnancy: Secondary | ICD-10-CM | POA: Diagnosis not present

## 2017-09-06 DIAGNOSIS — O30043 Twin pregnancy, dichorionic/diamniotic, third trimester: Secondary | ICD-10-CM | POA: Diagnosis present

## 2017-09-06 DIAGNOSIS — O30009 Twin pregnancy, unspecified number of placenta and unspecified number of amniotic sacs, unspecified trimester: Secondary | ICD-10-CM

## 2017-09-06 DIAGNOSIS — O328XX2 Maternal care for other malpresentation of fetus, fetus 2: Secondary | ICD-10-CM | POA: Diagnosis present

## 2017-09-06 DIAGNOSIS — Z8751 Personal history of pre-term labor: Secondary | ICD-10-CM | POA: Diagnosis present

## 2017-09-06 HISTORY — DX: Other specified health status: Z78.9

## 2017-09-06 LAB — URINALYSIS, ROUTINE W REFLEX MICROSCOPIC
Bilirubin Urine: NEGATIVE
GLUCOSE, UA: NEGATIVE mg/dL
KETONES UR: NEGATIVE mg/dL
NITRITE: NEGATIVE
PROTEIN: NEGATIVE mg/dL
Specific Gravity, Urine: 1.005 (ref 1.005–1.030)
pH: 7 (ref 5.0–8.0)

## 2017-09-06 LAB — TYPE AND SCREEN
ABO/RH(D): A POS
ANTIBODY SCREEN: NEGATIVE

## 2017-09-06 LAB — CBC
HEMATOCRIT: 30 % — AB (ref 36.0–46.0)
Hemoglobin: 9.9 g/dL — ABNORMAL LOW (ref 12.0–15.0)
MCH: 31 pg (ref 26.0–34.0)
MCHC: 33 g/dL (ref 30.0–36.0)
MCV: 94 fL (ref 78.0–100.0)
Platelets: 279 10*3/uL (ref 150–400)
RBC: 3.19 MIL/uL — AB (ref 3.87–5.11)
RDW: 13.5 % (ref 11.5–15.5)
WBC: 15.7 10*3/uL — AB (ref 4.0–10.5)

## 2017-09-06 LAB — OB RESULTS CONSOLE GBS: STREP GROUP B AG: NEGATIVE

## 2017-09-06 LAB — GROUP B STREP BY PCR: Group B strep by PCR: NEGATIVE

## 2017-09-06 MED ORDER — OXYTOCIN BOLUS FROM INFUSION
500.0000 mL | Freq: Once | INTRAVENOUS | Status: AC
  Administered 2017-09-07: 500 mL via INTRAVENOUS

## 2017-09-06 MED ORDER — MAGNESIUM SULFATE BOLUS VIA INFUSION
4.0000 g | Freq: Once | INTRAVENOUS | Status: DC
  Filled 2017-09-06: qty 500

## 2017-09-06 MED ORDER — OXYCODONE-ACETAMINOPHEN 5-325 MG PO TABS: 2.0000 | ORAL_TABLET | ORAL | Status: DC | PRN

## 2017-09-06 MED ORDER — BETAMETHASONE SOD PHOS & ACET 6 (3-3) MG/ML IJ SUSP
12.0000 mg | INTRAMUSCULAR | Status: DC
  Administered 2017-09-06: 12 mg via INTRAMUSCULAR
  Filled 2017-09-06: qty 2

## 2017-09-06 MED ORDER — FENTANYL CITRATE (PF) 100 MCG/2ML IJ SOLN
100.0000 ug | INTRAMUSCULAR | Status: DC | PRN
  Administered 2017-09-06: 100 ug via INTRAVENOUS
  Filled 2017-09-06: qty 2

## 2017-09-06 MED ORDER — LACTATED RINGERS IV SOLN
INTRAVENOUS | Status: DC
  Administered 2017-09-06 – 2017-09-07 (×2): via INTRAVENOUS

## 2017-09-06 MED ORDER — OXYTOCIN 40 UNITS IN LACTATED RINGERS INFUSION - SIMPLE MED
2.5000 [IU]/h | INTRAVENOUS | Status: DC
  Filled 2017-09-06: qty 1000

## 2017-09-06 MED ORDER — ACETAMINOPHEN 325 MG PO TABS: 650.0000 mg | ORAL_TABLET | ORAL | Status: DC | PRN

## 2017-09-06 MED ORDER — SOD CITRATE-CITRIC ACID 500-334 MG/5ML PO SOLN: 30.0000 mL | ORAL | Status: DC | PRN

## 2017-09-06 MED ORDER — MAGNESIUM SULFATE BOLUS VIA INFUSION
6.0000 g | Freq: Once | INTRAVENOUS | Status: AC
  Administered 2017-09-06: 6 g via INTRAVENOUS
  Filled 2017-09-06: qty 500

## 2017-09-06 MED ORDER — INDOMETHACIN 25 MG PO CAPS
100.0000 mg | ORAL_CAPSULE | Freq: Once | ORAL | Status: AC
  Administered 2017-09-06: 100 mg via ORAL
  Filled 2017-09-06: qty 4

## 2017-09-06 MED ORDER — INDOMETHACIN 25 MG PO CAPS
25.0000 mg | ORAL_CAPSULE | Freq: Four times a day (QID) | ORAL | Status: DC
  Filled 2017-09-06 (×3): qty 1

## 2017-09-06 MED ORDER — MAGNESIUM SULFATE 40 G IN LACTATED RINGERS - SIMPLE
2.0000 g/h | INTRAVENOUS | Status: DC
  Filled 2017-09-06: qty 500

## 2017-09-06 MED ORDER — LIDOCAINE HCL (PF) 1 % IJ SOLN
30.0000 mL | INTRAMUSCULAR | Status: DC | PRN
  Filled 2017-09-06: qty 30

## 2017-09-06 MED ORDER — FLEET ENEMA 7-19 GM/118ML RE ENEM: 1.0000 | ENEMA | RECTAL | Status: DC | PRN

## 2017-09-06 MED ORDER — LACTATED RINGERS IV SOLN: 500.0000 mL | INTRAVENOUS | Status: DC | PRN

## 2017-09-06 MED ORDER — ONDANSETRON HCL 4 MG/2ML IJ SOLN: 4.0000 mg | Freq: Four times a day (QID) | INTRAMUSCULAR | Status: DC | PRN

## 2017-09-06 MED ORDER — OXYCODONE-ACETAMINOPHEN 5-325 MG PO TABS: 1.0000 | ORAL_TABLET | ORAL | Status: DC | PRN

## 2017-09-06 NOTE — Progress Notes (Signed)
Patient ID: Tiffany Deleon, female   DOB: 08/23/1996, 21 y.o.   MRN: [id number] OB Attending  Pt c/o feeling more vaginal pressure SVE 6-7/80%/BBOW per nurse US verified [name] vertex, [name] transverse, back up, head RLQ Discussed vaginal delivery/possible breech delivery with pt and FOB Risks reviewed. Pt agrees to attempt vaginal delivery. Magnesium discontinued. Pt declines epidural at present. NICU notified Expectant management.

## 2017-09-06 NOTE — MAU Provider Note (Signed)
History     CSN: [id number]  Arrival date and time: 09/06/17 1703   None     Chief Complaint  Patient presents with  . Contractions  . Back Pain   HPI Tiffany Deleon is 21 y.o. G2P1001 2871w0d weeks presenting with contracting "all day since I woke up".  She is also having back pain.  Neg for vaginal bleeding until she just went to barthroom--red on tissue. Last intercourse 3 days ago.  States she has eaten and had a lot of water and juice today.  Denies N&V.  She is a patient in the Clinic. She had U/S at [facility] 11/10--[redacted]weeks gestation-- repeat U/S scheduled for 12/17. She is taking 81mg  ASA qd.  + for Group B Strept with last pregnancy.    No past medical history on file.  No past surgical history on file.  No family history on file.  Social History   Tobacco Use  . Smoking status: Never Smoker  . Smokeless tobacco: Never Used  Substance Use Topics  . Alcohol use: No  . Drug use: Yes    Types: Marijuana    Allergies: No Known Allergies  Medications Prior to Admission  Medication Sig Dispense Refill Last Dose  . aspirin EC 81 MG tablet Take 1 tablet (81 mg total) by mouth daily. Take after 12 weeks for prevention of preeclampsia later in pregnancy 300 tablet 2 Taking  . doxylamine, Sleep, (UNISOM) 25 MG tablet Take 1 tablet (25 mg total) by mouth every 8 (eight) hours as needed. (Patient not taking: Reported on 07/26/2017) 30 tablet 0 Not Taking  . Prenatal Vit-Fe Fumarate-FA (PRENATAL VITAMIN PO) Take by mouth.   Taking  . progesterone 200 MG SUPP Place 200 mg at bedtime vaginally.   Taking  . vitamin B-6 (PYRIDOXINE) 25 MG tablet Take 1 tablet (25 mg total) by mouth every 8 (eight) hours as needed. (Patient not taking: Reported on 07/26/2017) 20 tablet 0 Not Taking    Review of Systems  Constitutional: Negative for appetite change, chills and fever.  Respiratory: Negative for chest tightness and shortness of breath.   Cardiovascular: Negative for chest pain.   Gastrointestinal: Positive for abdominal pain (contractions).  Genitourinary: Positive for vaginal bleeding (no bleeding until she wiped on admission to MAU and saw small amount of bright red ). Negative for difficulty urinating, dysuria and hematuria.   Physical Exam   Blood pressure 127/73, pulse (!) 117, temperature 98 F (36.7 C), temperature source Oral, resp. rate 18, height 5' 5.5" (1.664 m), weight 204 lb (92.5 kg), last menstrual period 02/11/2017, SpO2 98 %, not currently breastfeeding.  Physical Exam  Constitutional: She is oriented to person, place, and time. She appears well-developed and well-nourished. No distress.  GI: There is tenderness (contracting).  Genitourinary: Uterus is not enlarged. There is bleeding in the vagina.  Genitourinary Comments: Cervix-4-5 cm dilated, 80%, bulging membranes.  [name] is vertex, [name] is transverse.   Neurological: She is alert and oriented to person, place, and time.  Skin: Skin is warm and dry.  Psychiatric: She has a normal mood and affect. Her behavior is normal.     Cervical Exam per Ginger, RN.  Results for orders placed or performed during the hospital encounter of 09/06/17 (from the past 24 hour(s))  Urinalysis, Routine w reflex microscopic     Status: Abnormal   Collection Time: 09/06/17  5:35 PM  Result Value Ref Range   Color, Urine AMBER (A) YELLOW   APPearance  HAZY (A) CLEAR   Specific Gravity, Urine 1.005 1.005 - 1.030   pH 7.0 5.0 - 8.0   Glucose, UA NEGATIVE NEGATIVE mg/dL   Hgb urine dipstick LARGE (A) NEGATIVE   Bilirubin Urine NEGATIVE NEGATIVE   Ketones, ur NEGATIVE NEGATIVE mg/dL   Protein, ur NEGATIVE NEGATIVE mg/dL   Nitrite NEGATIVE NEGATIVE   Leukocytes, UA LARGE (A) NEGATIVE   RBC / HPF 0-5 0 - 5 RBC/hpf   WBC, UA TOO NUMEROUS TO COUNT 0 - 5 WBC/hpf   Bacteria, UA RARE (A) NONE SEEN   Squamous Epithelial / LPF 0-5 (A) NONE SEEN   WBC Clumps PRESENT    Mucus PRESENT    NST --Baseline FHR [name]  155                                    [name] 150            Variability-Moderate            Contracting q 2-3 minutes             MAU Course  Procedures  MDM MSE Exam NST Reported to C. Tiffany MuffBrewer, CNM --for admission.  Room 171 Cared turned over to C. Tiffany MuffBrewer, CNM and Dr. Alysia PennaErvin  Assessment and Plan  A;  77101w0d Tiffany Deleon Twin gestation       Active Labor   P:  Admit   Tiffany Deleon 09/06/2017, 5:45 PM    OB Attending Will attempt to tocolyis pt first. Dr Eulah PontMurphy of NICU notified of admission and of plan to attempt to albeit labor. Do feel pt is stable d/t ut ctx for transfer.   Tiffany ElmMichael Borden Thune, MD

## 2017-09-06 NOTE — MAU Note (Signed)
Pt reports contractions all day, back pain.

## 2017-09-06 NOTE — H&P (Signed)
OBSTETRIC ADMISSION HISTORY AND PHYSICAL  Tiffany Deleon is 21 y.o. G2P1001 4262w0d weeks presenting with contracting "all day since I woke up".  She is also having back pain.  Neg for vaginal bleeding until she just went to barthroom--red on tissue. Last intercourse 3 days ago.  States she has eaten and had a lot of water and juice today.  Denies N&V.  She is a patient in the Clinic. She had an initial prenatal visit at 23 weeks and did not come to any more appointments except ultrasounds. She had U/S at [facility] 11/10--[redacted]weeks gestation-- repeat U/S scheduled for 12/17. She is taking 81mg  ASA qd due to the twin gestation.  + for Group B Strept with last pregnancy.    She [facility]   Dating: By 19 week u/s --->  Estimated Date of Delivery: 11/08/17  Prenatal History/Complications:  Past Medical History: No past medical history on file.  Past Surgical History: History reviewed. No pertinent surgical history.  Obstetrical History: OB History    Gravida Para Term Preterm AB Living   2 1 1     1    SAB TAB Ectopic Multiple Live Births         0 1      Social History: Social History   Socioeconomic History  . Marital status: Single    Spouse name: None  . Number of children: None  . Years of education: None  . Highest education level: None  Social Needs  . Financial resource strain: None  . Food insecurity - worry: None  . Food insecurity - inability: None  . Transportation needs - medical: None  . Transportation needs - non-medical: None  Occupational History  . None  Tobacco Use  . Smoking status: Never Smoker  . Smokeless tobacco: Never Used  Substance and Sexual Activity  . Alcohol use: No  . Drug use: Yes    Types: Marijuana  . Sexual activity: Yes    Birth control/protection: None  Other Topics Concern  . None  Social History Narrative  . None    Family History: History reviewed. No pertinent family history.  Allergies: No Known  Allergies  Medications Prior to Admission  Medication Sig Dispense Refill Last Dose  . aspirin EC 81 MG tablet Take 1 tablet (81 mg total) by mouth daily. Take after 12 weeks for prevention of preeclampsia later in pregnancy 300 tablet 2 Taking  . doxylamine, Sleep, (UNISOM) 25 MG tablet Take 1 tablet (25 mg total) by mouth every 8 (eight) hours as needed. (Patient not taking: Reported on 07/26/2017) 30 tablet 0 Not Taking  . Prenatal Vit-Fe Fumarate-FA (PRENATAL VITAMIN PO) Take by mouth.   Taking  . progesterone 200 MG SUPP Place 200 mg at bedtime vaginally.   Taking  . vitamin B-6 (PYRIDOXINE) 25 MG tablet Take 1 tablet (25 mg total) by mouth every 8 (eight) hours as needed. (Patient not taking: Reported on 07/26/2017) 20 tablet 0 Not Taking     Review of Systems   All systems reviewed and negative except as stated in HPI  Blood pressure 127/73, pulse (!) 117, temperature 98 F (36.7 C), temperature source Oral, resp. rate 18, height 5' 5.5" (1.664 m), weight 204 lb (92.5 kg), last menstrual period 02/11/2017, SpO2 98 %, not currently breastfeeding. General appearance: alert, cooperative and no distress Lungs: clear to auscultation bilaterally Heart: regular rate and rhythm Abdomen: soft, non-tender; bowel sounds normal Pelvic: n/a Extremities: Homans sign is negative,  no sign of DVT DTR's +2 Presentation: cephalic, transverse  Fetal Tracing:  Baby A Baseline: 155 Variability: moderate Accels: 15x15 Decels: none  Baby B Baseline: 150 Variability: moderate Accels:  15x15 Decels: none  Toco: 2-3  Dilation: 4.5 Effacement (%): 80 Exam by:: ginger morris rn   Prenatal labs: ABO, Rh:   Antibody:   Rubella: Immune (08/27 0000) RPR: Nonreactive (08/27 0000)  HBsAg: Negative (08/27 0000)  HIV: Non-reactive (08/27 0000)  GBS:     Prenatal Transfer Tool  Maternal Diabetes: No Genetic Screening: Normal Maternal Ultrasounds/Referrals: Normal Fetal Ultrasounds or  other Referrals:  None Maternal Substance Abuse:  No Significant Maternal Medications:  None Significant Maternal Lab Results: None  Results for orders placed or performed during the hospital encounter of 09/06/17 (from the past 24 hour(s))  Urinalysis, Routine w reflex microscopic   Collection Time: 09/06/17  5:35 PM  Result Value Ref Range   Color, Urine AMBER (A) YELLOW   APPearance HAZY (A) CLEAR   Specific Gravity, Urine 1.005 1.005 - 1.030   pH 7.0 5.0 - 8.0   Glucose, UA NEGATIVE NEGATIVE mg/dL   Hgb urine dipstick LARGE (A) NEGATIVE   Bilirubin Urine NEGATIVE NEGATIVE   Ketones, ur NEGATIVE NEGATIVE mg/dL   Protein, ur NEGATIVE NEGATIVE mg/dL   Nitrite NEGATIVE NEGATIVE   Leukocytes, UA LARGE (A) NEGATIVE   RBC / HPF 0-5 0 - 5 RBC/hpf   WBC, UA TOO NUMEROUS TO COUNT 0 - 5 WBC/hpf   Bacteria, UA RARE (A) NONE SEEN   Squamous Epithelial / LPF 0-5 (A) NONE SEEN   WBC Clumps PRESENT    Mucus PRESENT     Patient Active Problem List   Diagnosis Date Noted  . Preterm labor 09/06/2017  . Supervision of high risk pregnancy, antepartum 07/14/2017  . Pregnancy, twins 07/14/2017  . Short interval between pregnancies affecting pregnancy, antepartum 07/14/2017  . History of group B Streptococcus (GBS) infection 07/14/2017    Assessment/Plan:  Tiffany Deleon is a 21 y.o. G2P1001 at 5174w0d here for preterm labor with di/di twins, vertex/vertex. Discussed with Dr. Eulah PontMurphy due to NICU high census- ok to admit to labor and delivery due to dilation and contraction pattern  #Labor: Expectant management. Mag 6g bolus and 2g/hr. First dose BMZ today. Indocin PO #Pain: IV pain medication #FWB: Cat 1 #ID:  GBS pending #MOF: Breast #MOC: unsure #Circ:  no  Rolm BookbinderCaroline M Neill, CNM  09/06/2017, 7:06 PM   OB Attending  As noted above. Limited PNC except for US in MFM.  H/O shorten cervix, on Progesterone. Reports last dose was on Sat Ut ctx started this morning. Some spotting with  arrival to MAU. Denies any LOF U/S 08/23/18 [name] vtx, 1404 gms, [name] Breech 1523 gms, 8 % discordant growth Will attempt to tocolyis, Magnesium and Indocin. BMZ for fetal lung maturity Do not feel able to transfer d/t to contraction. Discussed admission and plan for tocolysis with Dr. Eulah PontMurphy, NICU. POC reviewed with pt.   Nettie ElmMichael Hoang Reich, MD

## 2017-09-07 ENCOUNTER — Encounter (HOSPITAL_COMMUNITY): Payer: Self-pay

## 2017-09-07 ENCOUNTER — Inpatient Hospital Stay (HOSPITAL_COMMUNITY): Payer: Medicaid Other | Admitting: Anesthesiology

## 2017-09-07 ENCOUNTER — Other Ambulatory Visit: Payer: Self-pay

## 2017-09-07 DIAGNOSIS — O30043 Twin pregnancy, dichorionic/diamniotic, third trimester: Secondary | ICD-10-CM

## 2017-09-07 DIAGNOSIS — Z3A31 31 weeks gestation of pregnancy: Secondary | ICD-10-CM

## 2017-09-07 DIAGNOSIS — O30009 Twin pregnancy, unspecified number of placenta and unspecified number of amniotic sacs, unspecified trimester: Secondary | ICD-10-CM

## 2017-09-07 LAB — RPR: RPR: NONREACTIVE

## 2017-09-07 MED ORDER — ACETAMINOPHEN 325 MG PO TABS: 650.0000 mg | ORAL_TABLET | ORAL | Status: DC | PRN

## 2017-09-07 MED ORDER — DIBUCAINE 1 % RE OINT: 1.0000 "application " | TOPICAL_OINTMENT | RECTAL | Status: DC | PRN

## 2017-09-07 MED ORDER — LACTATED RINGERS IV SOLN: 500.0000 mL | Freq: Once | INTRAVENOUS | Status: DC

## 2017-09-07 MED ORDER — ONDANSETRON HCL 4 MG PO TABS: 4.0000 mg | ORAL_TABLET | ORAL | Status: DC | PRN

## 2017-09-07 MED ORDER — DIPHENHYDRAMINE HCL 25 MG PO CAPS: 25.0000 mg | ORAL_CAPSULE | Freq: Four times a day (QID) | ORAL | Status: DC | PRN

## 2017-09-07 MED ORDER — OXYCODONE HCL 5 MG PO TABS: 5.0000 mg | ORAL_TABLET | ORAL | Status: DC | PRN

## 2017-09-07 MED ORDER — EPHEDRINE 5 MG/ML INJ
10.0000 mg | INTRAVENOUS | Status: DC | PRN
  Filled 2017-09-07: qty 2

## 2017-09-07 MED ORDER — FENTANYL 2.5 MCG/ML BUPIVACAINE 1/10 % EPIDURAL INFUSION (WH - ANES)
INTRAMUSCULAR | Status: AC
  Filled 2017-09-07: qty 100

## 2017-09-07 MED ORDER — FENTANYL 2.5 MCG/ML BUPIVACAINE 1/10 % EPIDURAL INFUSION (WH - ANES)
14.0000 mL/h | INTRAMUSCULAR | Status: DC | PRN
  Administered 2017-09-07: 14 mL/h via EPIDURAL

## 2017-09-07 MED ORDER — BENZOCAINE-MENTHOL 20-0.5 % EX AERO: 1.0000 "application " | INHALATION_SPRAY | CUTANEOUS | Status: DC | PRN

## 2017-09-07 MED ORDER — PHENYLEPHRINE 40 MCG/ML (10ML) SYRINGE FOR IV PUSH (FOR BLOOD PRESSURE SUPPORT)
80.0000 ug | PREFILLED_SYRINGE | INTRAVENOUS | Status: DC | PRN
  Filled 2017-09-07: qty 5

## 2017-09-07 MED ORDER — IBUPROFEN 600 MG PO TABS
600.0000 mg | ORAL_TABLET | Freq: Four times a day (QID) | ORAL | Status: DC
  Administered 2017-09-07 – 2017-09-09 (×8): 600 mg via ORAL
  Filled 2017-09-07 (×8): qty 1

## 2017-09-07 MED ORDER — DIPHENHYDRAMINE HCL 50 MG/ML IJ SOLN: 12.5000 mg | INTRAMUSCULAR | Status: DC | PRN

## 2017-09-07 MED ORDER — SIMETHICONE 80 MG PO CHEW: 80.0000 mg | CHEWABLE_TABLET | ORAL | Status: DC | PRN

## 2017-09-07 MED ORDER — ONDANSETRON HCL 4 MG/2ML IJ SOLN: 4.0000 mg | INTRAMUSCULAR | Status: DC | PRN

## 2017-09-07 MED ORDER — COCONUT OIL OIL: 1.0000 "application " | TOPICAL_OIL | Status: DC | PRN

## 2017-09-07 MED ORDER — ZOLPIDEM TARTRATE 5 MG PO TABS: 5.0000 mg | ORAL_TABLET | Freq: Every evening | ORAL | Status: DC | PRN

## 2017-09-07 MED ORDER — SODIUM CHLORIDE 0.9 % IV SOLN
2.0000 g | Freq: Four times a day (QID) | INTRAVENOUS | Status: DC
  Filled 2017-09-07 (×2): qty 2000

## 2017-09-07 MED ORDER — PHENYLEPHRINE 40 MCG/ML (10ML) SYRINGE FOR IV PUSH (FOR BLOOD PRESSURE SUPPORT)
PREFILLED_SYRINGE | INTRAVENOUS | Status: AC
  Filled 2017-09-07: qty 20

## 2017-09-07 MED ORDER — LIDOCAINE HCL (PF) 1 % IJ SOLN
INTRAMUSCULAR | Status: DC | PRN
  Administered 2017-09-07 (×2): 4 mL via EPIDURAL

## 2017-09-07 MED ORDER — PRENATAL MULTIVITAMIN CH
1.0000 | ORAL_TABLET | Freq: Every day | ORAL | Status: DC
  Administered 2017-09-07 – 2017-09-08 (×2): 1 via ORAL
  Filled 2017-09-07 (×2): qty 1

## 2017-09-07 MED ORDER — SENNOSIDES-DOCUSATE SODIUM 8.6-50 MG PO TABS
2.0000 | ORAL_TABLET | ORAL | Status: DC
  Administered 2017-09-07 – 2017-09-08 (×2): 2 via ORAL
  Filled 2017-09-07 (×2): qty 2

## 2017-09-07 MED ORDER — WITCH HAZEL-GLYCERIN EX PADS
1.0000 | MEDICATED_PAD | CUTANEOUS | Status: DC | PRN
Start: 2017-09-07 — End: 2017-09-09

## 2017-09-07 MED ORDER — TETANUS-DIPHTH-ACELL PERTUSSIS 5-2.5-18.5 LF-MCG/0.5 IM SUSP: 0.5000 mL | Freq: Once | INTRAMUSCULAR | Status: DC

## 2017-09-07 NOTE — Consult Note (Signed)
[facility]  Delivery Note:  SVD    09/07/2017  7:48 AM  I was called to the delivery room at the request of the patient's obstetrician (Dr. Alysia PennaErvin) for delivery of 31 week twins.  PRENATAL HX:  This is a 21 y/o G2P1001 at 8431 and 1/[redacted] weeks gestation who was admitted for preterm labor.  Her pregnancy has been complicated by late prenatal care (entry 23 weeks).  AROM of baby A was about 30 minutes prior to delivery and he was delivered vertex.  AROM of baby B was minutes prior to delivery and she was delivered double footling breech.    DELIVERY A:  Infant was vigorous at delivery, requiring no resuscitation other than standard warming, drying and stimulation.  APGARs 8 and 9.  Exam within normal limits.    DELIVERY B:  Infant was apneic at delivery despite warming, drying and stimulation.  PPV was initiated and HR quikcly rose to > 100 bpm.  Despite multiple rounds of PPV, infant did not have any spontaneous respirations.  O2 saturations remained > 90% and HR remained ~ 150 but infant was intubated at ~ 7 minutes of age for continued apnea.  APGARs 2, 4 and 6.  Exam within normal limits.  Infant was admitted to NICU intubated with NeoPuff settings on 22/5, 40%.      _____________________ Electronically Signed By: Maryan CharLindsey Rena Hunke, MD Neonatologist

## 2017-09-07 NOTE — Lactation Note (Addendum)
This note was copied from a baby's chart. Lactation Consultation Note  Patient Name: Nelle DonBoyA Samirah Scarpulla ZOXWR'UToday's Date: 09/07/2017 Reason for consult: Initial assessment;Preterm <34wks;Multiple gestation;NICU baby;1st time breastfeeding   Initial assessment with mom of preterm twins in the NICU. Mom did not BF her first child but would like to BF these babies. Discussed importance of BF preterm twins.   Mom had just finished pumping when LC arrived. Mom had pumped a few gtts from the left breast. Showed mom how to hand express and obtained 10 cc colostrum. Mom was pleased. Mom pumped with 27 flanges, changed her to the 24 flanges for a better fit.   Mom with soft compressible breasts and areola. Colostrum very easily expressible.  Mom was able to hand express also.   Providing Milk for Your Baby in NICU Booklet given and reviewed. Reviewed pumping every 2-3 hours for 15 minutes with DEBP on initiate setting, what to expect with pumping, milk coming to volume, hand expression, and storage of breast milk for the NICU infant.   [facility] and [facility] [facility] office with mom's knowledge.   Mom reports she has no questions/concerns at this time. Mom pleased to get colostrum for babies. 10 ml of colostrum collected and taken to the NICU. Breast milk labels brought back to mom.   BF Resources handout and LC Brochure given, mom informed of IP/OP Services, BF Support Groups and LC phone #.      Maternal Data Formula Feeding for Exclusion: No Has patient been taught Hand Expression?: Yes Does the patient have breastfeeding experience prior to this delivery?: No  Feeding Feeding Type: Donor Breast Milk Length of feed: 30 min  LATCH Score                   Interventions    Lactation Tools Discussed/Used WIC Program: Yes Pump Review: Setup, frequency, and cleaning;Milk Storage Initiated by:: Bedside RN   Consult  Status Consult Status: Follow-up Date: 09/08/17 Follow-up type: In-patient    Silas FloodSharon S Modupe Shampine 09/07/2017, 3:25 PM

## 2017-09-07 NOTE — Anesthesia Preprocedure Evaluation (Signed)
Anesthesia Evaluation  Patient identified by MRN, date of birth, ID band Patient awake    Reviewed: Allergy & Precautions, H&P , NPO status , Patient's Chart, lab work & pertinent test results  History of Anesthesia Complications Negative for: history of anesthetic complications  Airway Mallampati: II  TM Distance: >3 FB Neck ROM: full    Dental no notable dental hx. (+) Teeth Intact   Pulmonary neg pulmonary ROS,    Pulmonary exam normal breath sounds clear to auscultation       Cardiovascular negative cardio ROS Normal cardiovascular exam Rhythm:regular Rate:Normal     Neuro/Psych negative neurological ROS  negative psych ROS   GI/Hepatic negative GI ROS, Neg liver ROS,   Endo/Other  negative endocrine ROS  Renal/GU negative Renal ROS  negative genitourinary   Musculoskeletal   Abdominal (+) + obese,   Peds  Hematology negative hematology ROS (+)   Anesthesia Other Findings   Reproductive/Obstetrics (+) Pregnancy Pre-term Twins                             Anesthesia Physical Anesthesia Plan  ASA: II  Anesthesia Plan: Epidural   Post-op Pain Management:    Induction:   PONV Risk Score and Plan:   Airway Management Planned:   Additional Equipment:   Intra-op Plan:   Post-operative Plan:   Informed Consent: I have reviewed the patients History and Physical, chart, labs and discussed the procedure including the risks, benefits and alternatives for the proposed anesthesia with the patient or authorized representative who has indicated his/her understanding and acceptance.     Plan Discussed with:   Anesthesia Plan Comments:         Anesthesia Quick Evaluation

## 2017-09-07 NOTE — Plan of Care (Signed)
  Activity: Will verbalize the importance of balancing activity with adequate rest periods 09/07/2017 2037 - Progressing by Bobbye MortonAcheampong, Jenea Dake, RN Note  Pt will continue with daily activities as tolerated and have adequate rest.

## 2017-09-07 NOTE — Anesthesia Postprocedure Evaluation (Signed)
Anesthesia Post Note  Patient: Tiffany Deleon  Procedure(s) Performed: AN AD HOC LABOR EPIDURAL     Patient location during evaluation: Women's Unit Anesthesia Type: Epidural Level of consciousness: awake, awake and alert, oriented and patient cooperative Pain management: pain level controlled Vital Signs Assessment: post-procedure vital signs reviewed and stable Respiratory status: spontaneous breathing, nonlabored ventilation and respiratory function stable Cardiovascular status: stable Postop Assessment: no headache, no backache, patient able to bend at knees and no apparent nausea or vomiting Anesthetic complications: no    Last Vitals:  Vitals:   09/07/17 0850 09/07/17 1000  BP: 130/69 128/77  Pulse: (!) 112 (!) 113  Resp: 18 18  Temp: 36.9 C 36.9 C  SpO2: 99% 99%    Last Pain:  Vitals:   09/07/17 1000  TempSrc: Oral  PainSc:    Pain Goal:                 [name]

## 2017-09-07 NOTE — Anesthesia Procedure Notes (Signed)
Epidural Patient location during procedure: OB Start time: 09/07/2017 12:48 AM  Staffing Anesthesiologist: Leonides GrillsEllender, Collen Vincent P, MD Performed: anesthesiologist   Preanesthetic Checklist Completed: patient identified, site marked, pre-op evaluation, timeout performed, IV checked, risks and benefits discussed and monitors and equipment checked  Epidural Patient position: sitting Prep: DuraPrep Patient monitoring: heart rate, cardiac monitor, continuous pulse ox and blood pressure Approach: midline Location: L4-L5 Injection technique: LOR air  Needle:  Needle type: Tuohy  Needle gauge: 17 G Needle length: 9 cm Needle insertion depth: 8 cm Catheter type: closed end flexible Catheter size: 19 Gauge Catheter at skin depth: 13 cm Test dose: negative and Other  Assessment Events: blood not aspirated, injection not painful, no injection resistance and negative IV test  Additional Notes Informed consent obtained prior to proceeding including risk of failure, 1% risk of PDPH, risk of minor discomfort and bruising. Discussed alternatives to epidural analgesia and patient desires to proceed.  Timeout performed pre-procedure verifying patient name, procedure, and platelet count.  Patient tolerated procedure well. Reason for block:procedure for pain

## 2017-09-08 NOTE — Progress Notes (Signed)
Post Partum Day 1 Subjective: no complaints, up ad lib, voiding and tolerating PO  Objective: Blood pressure 117/76, pulse 87, temperature (!) 97.5 F (36.4 C), temperature source Oral, resp. rate 18, height 5' 5.5" (1.664 m), weight 190 lb (86.2 kg), last menstrual period 02/11/2017, SpO2 98 %, unknown if currently breastfeeding.  Physical Exam:  General: alert, cooperative and appears stated age Lochia: appropriate Uterine Fundus: firm DVT Evaluation: No evidence of DVT seen on physical exam.  Recent Labs    09/06/17 1839  HGB 9.9*  HCT 30.0*    Assessment/Plan: Plan for discharge tomorrow, Breastfeeding and Contraception IUD   LOS: 2 days   Reva Boresanya S Monserratt Knezevic 09/08/2017, 11:53 AM

## 2017-09-08 NOTE — Progress Notes (Signed)
I offered emotional support to pt on 12/4 at 3:30 pm. She reported that they are doing well.  In addition to their twins, they have a 22 year old child.  [facility]  They have a big support system from both sides of the family.  She did not express any particular concerns, but was appreciative of the visit and is open to us checking in with her again.    Chaplain Dyanne CarrelKaty Amritpal Shropshire, Bcc Pager, [phone] 4:41 PM    09/08/17 1600  Clinical Encounter Type  Visited With Patient  Visit Type Spiritual support  Referral From Patient

## 2017-09-09 MED ORDER — IBUPROFEN 600 MG PO TABS: 600.0000 mg | ORAL_TABLET | Freq: Four times a day (QID) | ORAL | 0 refills | Status: DC

## 2017-09-09 NOTE — Discharge Instructions (Signed)

## 2017-09-09 NOTE — Discharge Summary (Signed)
Obstetric Discharge Summary Reason for Admission: onset of labor Prenatal Procedures: none and ultrasound Intrapartum Procedures: spontaneous vaginal delivery DiDi twins Postpartum Procedures: none Complications-Operative and Postpartum: none Hemoglobin  Date Value Ref Range Status  09/06/2017 9.9 (L) 12.0 - 15.0 g/dL Final  13/08/657808/27/2018 46.910.7  Final   HCT  Date Value Ref Range Status  09/06/2017 30.0 (L) 36.0 - 46.0 % Final  05/31/2017 32  Final    Physical Exam:  General: alert, cooperative and no distress Lochia: appropriate Uterine Fundus: firm Incision:  DVT Evaluation: No evidence of DVT seen on physical exam.  Discharge Diagnoses: Premature labor with Preterm delivery of DiDi twins  Discharge Information: Date: 09/09/2017 Activity: pelvic rest Diet: routine Medications: Ibuprofen Condition: stable Instructions: refer to practice specific booklet Discharge to: home Follow-up Information    [facility] week(s).   Why:  pp visit Contact information: [address] [place] [phone]          Newborn Data:   [name], BoyA Chantea [[id number]][[id number]]  Live born female  Birth Weight: 3 lb 4.2 oz (1480 g) APGAR: 8, 9  Newborn Delivery   Birth date/time:  09/07/2017 06:36:00 Delivery type:  Vaginal, Spontaneous      Dominica SeverinGathings, GirlB Sherell [[id number]][[id number]]  Live born female  Birth Weight: 3 lb 10.6 oz (1660 g) APGAR: 2, 4  Newborn Delivery   Birth date/time:  09/07/2017 07:09:00 Delivery type:  Vaginal, Spontaneous    Both twins in NICU doing well on CPAP.  Lazaro ArmsLuther H Mont Jagoda 09/09/2017, 7:48 AM

## 2017-09-09 NOTE — Lactation Note (Signed)
This note was copied from a baby's chart. Lactation Consultation Note; Mom reports pumping is going well. Pumped 3 times yesterday. Reports breasts are feeling fuller this morning. Last pumped about 7 am and obtained about 30 ml. Reviewed importance of frequent pumping to prevent engorgement. [facility] No questions at present. Reviewed our phone number to call with questions/concerns.  Patient Name: Nelle DonBoyA Lorian Fenderson YNWGN'FToday's Date: 09/09/2017 Reason for consult: NICU baby;Multiple gestation   Maternal Data Formula Feeding for Exclusion: Yes Reason for exclusion: Mother's choice to formula and breast feed on admission Has patient been taught Hand Expression?: Yes Does the patient have breastfeeding experience prior to this delivery?: No  Feeding Feeding Type: Donor Breast Milk Length of feed: 30 min  LATCH Score                   Interventions    Lactation Tools Discussed/Used WIC Program: Yes   Consult Status Consult Status: Complete    Pamelia HoitWeeks, Jef Futch D 09/09/2017, 8:48 AM

## 2017-09-09 NOTE — Progress Notes (Signed)
Discharge teaching complete with pt. Pt understood all instructions and did not have any questions. Pt has WIC pump to take home.

## 2017-09-19 NOTE — Clinical Social Work Maternal (Signed)
The following note was taken from patient's newborn's chart:  CLINICAL SOCIAL WORK MATERNAL/CHILD NOTE  Patient Details Girard NTI:[id number] Date of Birth:09/07/2018  Date:09/19/2017  Clinical Social Worker Initiating Note:Zaniyah Wernette LCSW([place])Date/Time: Initiated:09/19/17/1305  Child's Name:Tiffany Deleon Chief Executive Officer Parents:Mother, Father  Need for Interpreter:None  Reason for Referral:Current Substance Use/Substance Use During Pregnancy   Address:[address] [place] [address]  Phone [phone] (home)  Additional phone number:   Household Members/Support Persons (HM/SP):Household Member/Support Person 1, Household Member/Support Person 2, Household Member/Support Person 3, Household Member/Support Person 4   HM/SP Name Relationship DOB or Age  HM/SP -1 Carlyn Reichert father 05/06/95  HM/SP -2 Nupur Hohman Mother 12/04/94  HM/SP -3 Wyline Copas daughter 07/21/16  HM/SP -4 paternal grandmother refused to give name or info paternal grandmother refused to give info  HM/SP -5     HM/SP -6     HM/SP -7     HM/SP -8       Natural Supports (not living in the home):Extended Family, Friends  Professional Supports:None  Employment:Unemployed  Type of Work:  [place] arranged:   Financial Resources:Medicaid, Other(Comment)  Other Resources:Food Stamps , [facility]  Cultural/Religious Considerations Which May Impact Care:none  Strengths:Ability to meet basic needs , Home prepared for child   Psychotropic Medications:   Pediatrician:   Pediatrician List:   [place]     Pediatrician Fax Number:   Risk Factors/Current Problems:Substance Use   Cognitive  State:Alert   Mood/Affect:Flat , Irritable   CSW Assessment: CSW met with MOB and FOB in the NICU conference room.CSW explained CSW's role and encouraged them to ask questions. MOB gave CSW permission to complete the assessment while FOB was present.MOB and FOB appeared irritated with CSW for meeting with them as evidence by FOB's tone and the lack of information provided by MOB and FOB.  CSW asked about MOB's SA hx and MOB openly shared that MOB smoke marijuana throughout pregnancy.MOB reported MOB last smoked about 1 week ago.CSW thanked MOB for [employer]. MOB stated that MOB smoked to increase MOB's appetite and to decease MOB's nausea.MOB denied the use of all other illicit substance.MOB also declined resources for SA interventions. CSW informed MOB of the hospital's policy and procedures regarding perinatal SA. MOB was made aware of the 2 drug screenings for the infant.MOB was understanding and did not have any concerns. CSW informed MOB that the twins UDS were negative however there CDS results were positive. CSW made MOB and FOB aware that CSW will be making a report to [facility] CPS.CSW explained the CPS process and FOB requested to be notified when [facility] CPS visit with the twins in the NICU. CSW attempted to explain to FOB that CSW has no control when CPS visits however, CSWwill communicate FOB's request to CPS.FOB became upset with CSW's response and exited room.After FOB left, MOB communicated, "I don't have any questions, so I'm leaving too."CSW left a message for [facility] CPS.  CSW will continue to offer support and assess family for psychosocial stressors while twins remain in NICU.  CSW Plan/Description:Child Protective Service Report , CSW Will Continue to Monitor Umbilical Cord Tissue Drug Screen Results and Make Report if Warranted, Psychosocial Support and Ongoing Assessment of Needs, Hospital Drug Screen Policy  Information, Other Information/Referral to [employer], [place] 09/19/2017,1:23  PM

## 2017-09-20 ENCOUNTER — Ambulatory Visit (HOSPITAL_COMMUNITY): Payer: Medicaid Other

## 2017-09-20 ENCOUNTER — Encounter (HOSPITAL_COMMUNITY): Payer: Self-pay

## 2017-10-18 ENCOUNTER — Ambulatory Visit (HOSPITAL_COMMUNITY): Payer: Medicaid Other

## 2017-10-21 ENCOUNTER — Ambulatory Visit: Payer: Medicaid Other | Admitting: Student

## 2018-01-10 ENCOUNTER — Encounter: Payer: Self-pay | Admitting: *Deleted

## 2018-01-31 ENCOUNTER — Encounter: Payer: Self-pay | Admitting: *Deleted

## 2018-01-31 ENCOUNTER — Ambulatory Visit: Payer: Medicaid Other | Admitting: Obstetrics & Gynecology

## 2018-01-31 NOTE — Progress Notes (Signed)
Tiffany Deleon did not keep her scheduled appointment . Per discussion with provider  does not need to be called, may reschedule if she calls.

## 2018-05-20 ENCOUNTER — Emergency Department (HOSPITAL_BASED_OUTPATIENT_CLINIC_OR_DEPARTMENT_OTHER)
Admission: EM | Admit: 2018-05-20 | Discharge: 2018-05-20 | Disposition: A | Discharge status: Home / Self Care | Payer: Medicaid Other | Attending: Emergency Medicine | Admitting: Emergency Medicine

## 2018-05-20 ENCOUNTER — Encounter (HOSPITAL_BASED_OUTPATIENT_CLINIC_OR_DEPARTMENT_OTHER): Payer: Self-pay

## 2018-05-20 ENCOUNTER — Emergency Department (HOSPITAL_BASED_OUTPATIENT_CLINIC_OR_DEPARTMENT_OTHER): Payer: Medicaid Other

## 2018-05-20 ENCOUNTER — Other Ambulatory Visit: Payer: Self-pay

## 2018-05-20 DIAGNOSIS — S0010XA Contusion of unspecified eyelid and periocular area, initial encounter: Secondary | ICD-10-CM | POA: Insufficient documentation

## 2018-05-20 DIAGNOSIS — Y998 Other external cause status: Secondary | ICD-10-CM | POA: Insufficient documentation

## 2018-05-20 DIAGNOSIS — S0990XA Unspecified injury of head, initial encounter: Secondary | ICD-10-CM

## 2018-05-20 DIAGNOSIS — Y9301 Activity, walking, marching and hiking: Secondary | ICD-10-CM | POA: Insufficient documentation

## 2018-05-20 DIAGNOSIS — Y929 Unspecified place or not applicable: Secondary | ICD-10-CM | POA: Insufficient documentation

## 2018-05-20 DIAGNOSIS — W01198A Fall on same level from slipping, tripping and stumbling with subsequent striking against other object, initial encounter: Secondary | ICD-10-CM | POA: Insufficient documentation

## 2018-05-20 LAB — PREGNANCY, URINE: PREG TEST UR: NEGATIVE

## 2018-05-20 NOTE — ED Notes (Signed)
Patient transported to CT 

## 2018-05-20 NOTE — ED Provider Notes (Signed)
[facility] EMERGENCY DEPARTMENT Provider Note   CSN: [id number] Arrival date & time: 05/20/18  1239     History   Chief Complaint Chief Complaint  Patient presents with  . Head Injury    HPI Tiffany Deleon is a 22 y.o. female.  HPI Patient states that 3 days ago she tripped and fell into a pole striking her forehead.  Initially had swelling at the site.  No loss of consciousness.  States she is had ongoing headache.  No visual changes.  No nausea or vomiting.  Today she noticed periorbital ecchymoses bilaterally right greater than left.  Denies neck pain.  No focal weakness or numbness. Past Medical History:  Diagnosis Date  . Medical history non-contributory     Patient Active Problem List   Diagnosis Date Noted  . Twin pregnancy delivered vaginally 09/07/2017  . Preterm labor 09/06/2017  . Supervision of high risk pregnancy, antepartum 07/14/2017  . Pregnancy, twins 07/14/2017  . Short interval between pregnancies affecting pregnancy, antepartum 07/14/2017  . History of group B Streptococcus (GBS) infection 07/14/2017    Past Surgical History:  Procedure Laterality Date  . NO PAST SURGERIES       OB History    Gravida  2   Para  2   Term  1   Preterm  1   AB  0   Living  3     SAB  0   TAB  0   Ectopic  0   Multiple  1   Live Births  3            Home Medications    Prior to Admission medications   Medication Sig Start Date End Date Taking? Authorizing Provider  ibuprofen (ADVIL,MOTRIN) 600 MG tablet Take 1 tablet (600 mg total) by mouth every 6 (six) hours. 09/09/17   Lazaro ArmsEure, Luther H, MD  Prenatal Vit-Fe Fumarate-FA (PRENATAL VITAMIN PO) Take by mouth.    [provider]    Family History No family history on file.  Social History Social History   Tobacco Use  . Smoking status: Never Smoker  . Smokeless tobacco: Never Used  Substance Use Topics  . Alcohol use: No  . Drug use: Yes    Types: Marijuana      Allergies   Patient has no known allergies.   Review of Systems Review of Systems  Constitutional: Negative for chills and fever.  HENT: Positive for facial swelling. Negative for sore throat and trouble swallowing.   Eyes: Negative for photophobia, pain and visual disturbance.  Respiratory: Negative for cough and shortness of breath.   Cardiovascular: Negative for chest pain, palpitations and leg swelling.  Gastrointestinal: Negative for abdominal pain, constipation, diarrhea, nausea and vomiting.  Musculoskeletal: Negative for back pain, myalgias and neck pain.  Skin: Negative for rash and wound.  Neurological: Positive for headaches. Negative for dizziness, syncope, speech difficulty, weakness and numbness.  All other systems reviewed and are negative.    Physical Exam Updated Vital Signs BP 120/81 (BP Location: Left Arm)   Pulse 96   Temp 98.5 F (36.9 C) (Oral)   Resp 18   Ht 5\' 5"  (1.651 m)   Wt 82.6 kg   LMP 04/26/2018   SpO2 98%   BMI 30.29 kg/m   Physical Exam  Constitutional: She is oriented to person, place, and time. She appears well-developed and well-nourished. No distress.  HENT:  Head: Normocephalic and atraumatic.  Mouth/Throat: Oropharynx is clear and moist.  Central forehead tenderness to palpation.  Bilateral periorbital ecchymosis.  Right greater than left.  Midface is stable.  No malocclusion.  No intraoral trauma.  No hemotympanum bilaterally.  No posterior auricular hematomas.  Eyes: Pupils are equal, round, and reactive to light. EOM are normal.  Neck: Normal range of motion. Neck supple. No JVD present.  No posterior midline cervical tenderness to palpation.  Cardiovascular: Normal rate and regular rhythm.  Pulmonary/Chest: Effort normal and breath sounds normal.  Abdominal: Soft. Bowel sounds are normal. There is no tenderness. There is no rebound and no guarding.  Musculoskeletal: Normal range of motion. She exhibits no edema or  tenderness.  Lymphadenopathy:    She has no cervical adenopathy.  Neurological: She is alert and oriented to person, place, and time.  5/5 motor in all extremities.  Sensation fully intact.  Skin: Skin is warm and dry. Capillary refill takes less than 2 seconds. No rash noted. She is not diaphoretic. No erythema.  Psychiatric: She has a normal mood and affect. Her behavior is normal.  Nursing note and vitals reviewed.    ED Treatments / Results  Labs (all labs ordered are listed, but only abnormal results are displayed) Labs Reviewed  PREGNANCY, URINE    EKG None  Radiology No results found.  Procedures Procedures (including critical care time)  Medications Ordered in ED Medications - No data to display   Initial Impression / Assessment and Plan / ED Course  I have reviewed the triage vital signs and the nursing notes.  Pertinent labs & imaging results that were available during my care of the patient were reviewed by me and considered in my medical decision making (see chart for details).     CT head without acute findings.  Given head injury precautions.  Final Clinical Impressions(s) / ED Diagnoses   Final diagnoses:  Closed head injury, initial encounter  Periorbital ecchymosis, unspecified laterality, initial encounter    ED Discharge Orders    None       Loren RacerYelverton, Odell Fasching, MD 05/22/18 1515

## 2018-05-20 NOTE — ED Notes (Signed)
ED Provider at bedside. 

## 2018-05-20 NOTE — ED Notes (Signed)
Waiting on Upreg prior to performing CT -- pt states she could possibly be pregnant, wants to wait

## 2018-05-20 NOTE — ED Triage Notes (Signed)
Pt states she tripped and fell into a pole 8/13-c/o HA-scratch noted to mid forehead-slight bruising under both eyes with right >left-pt denies LOC with injury-denies visual and gait disturbance, n/v-NAD-steady gait

## 2019-06-06 IMAGING — US US MFM OB TRANSVAGINAL
2 series · 12 of 28 positions shown · non-contrast
Comparison: none

[Series 1: us mfm ob transvaginal · 141 acquisitions, 11 frames shown (1 of 2)]
[im 6/141]
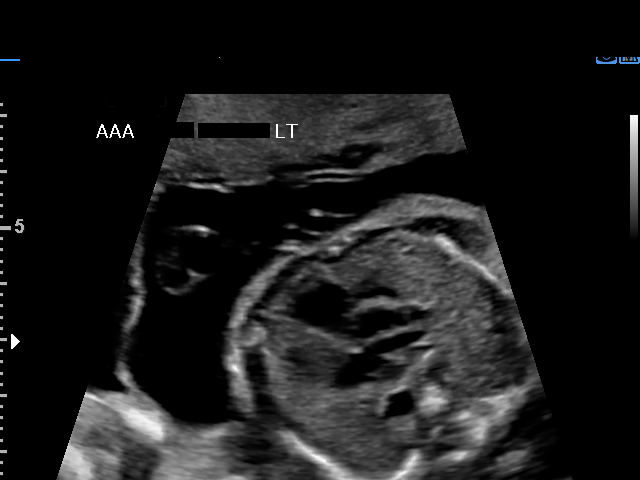
[im 17/141]
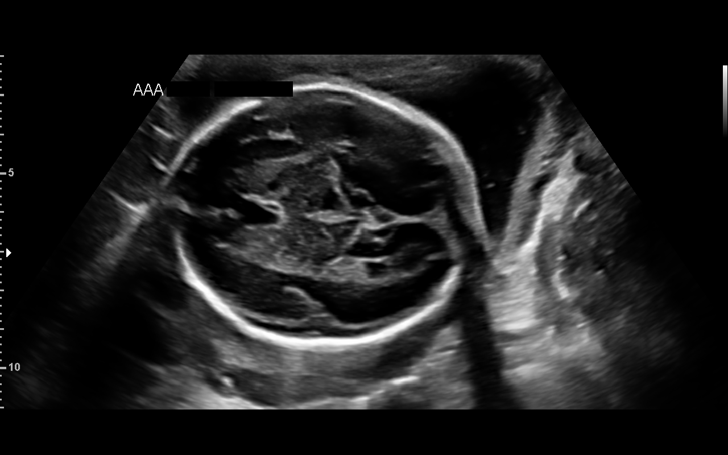
[im 29/141]
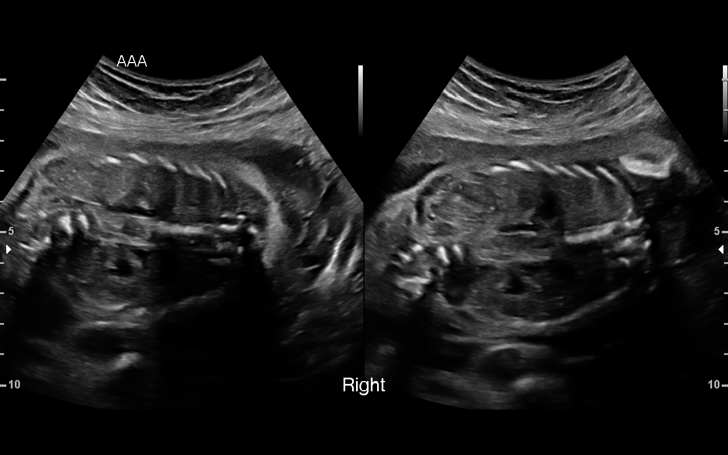
[im 45/141]
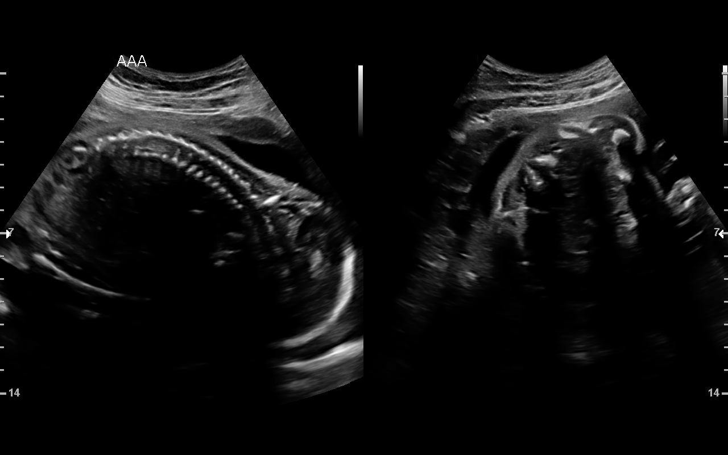
[im 57/141]
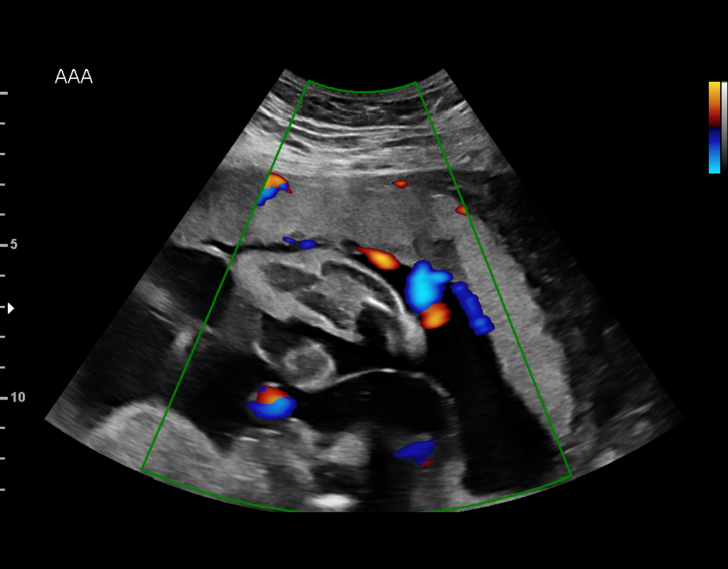
[im 68/141]
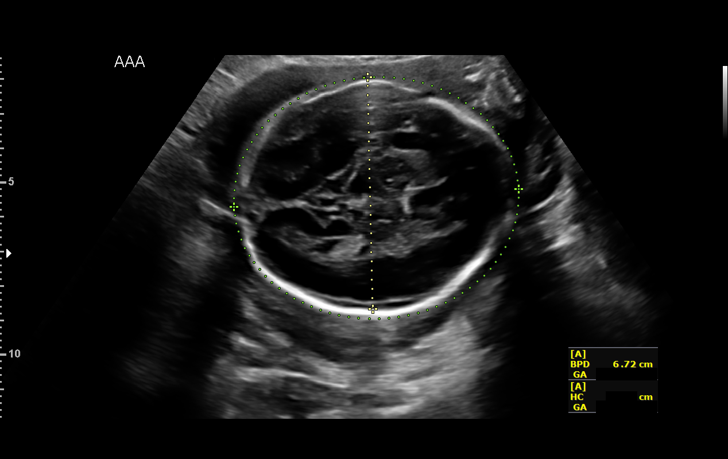
[im 85/141]
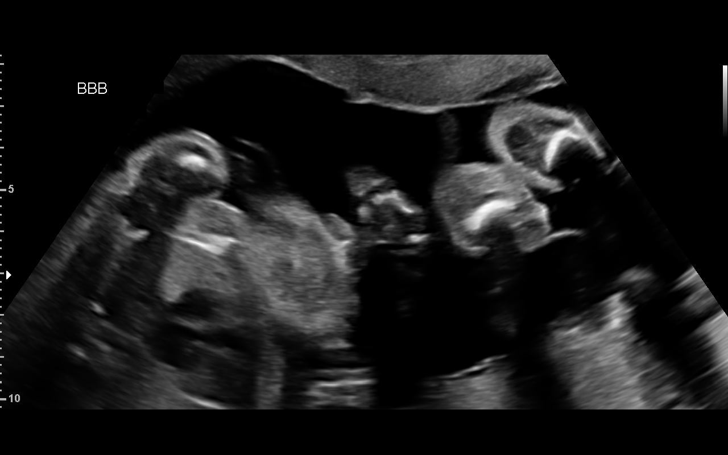
[im 96/141]
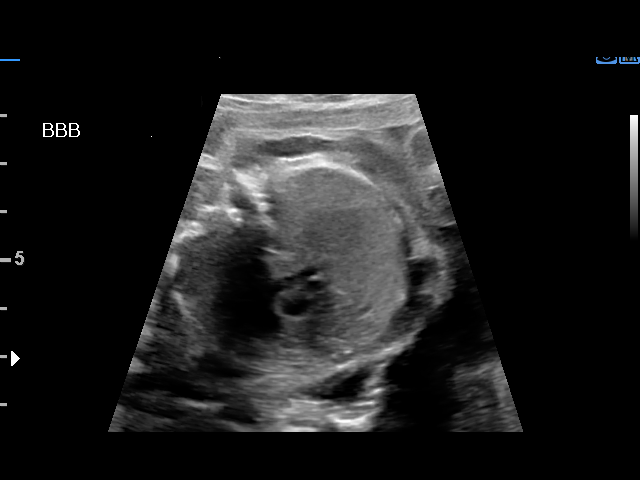
[im 107/141]
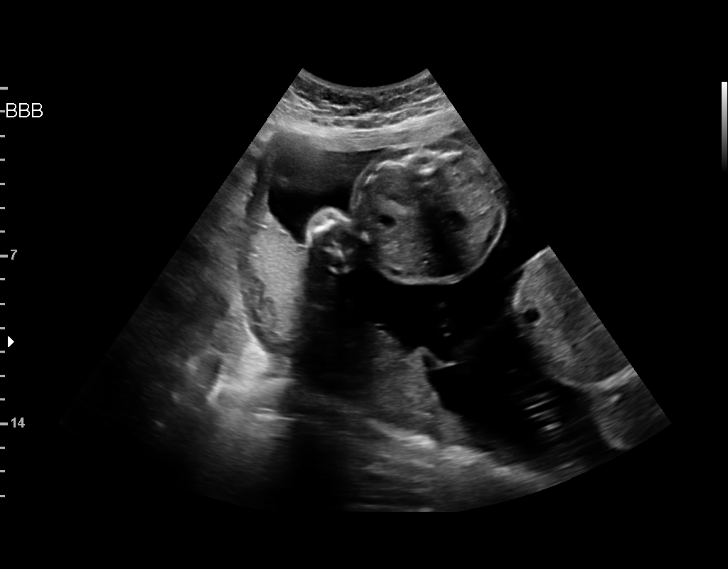
[im 124/141]
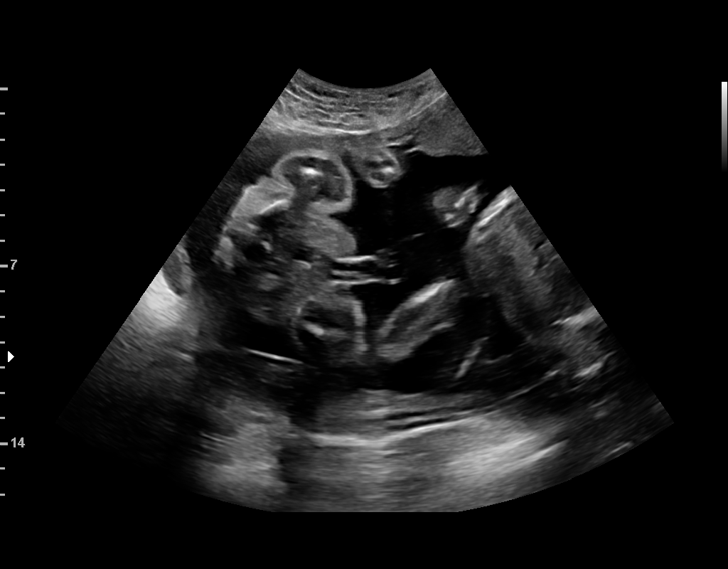
[im 135/141]
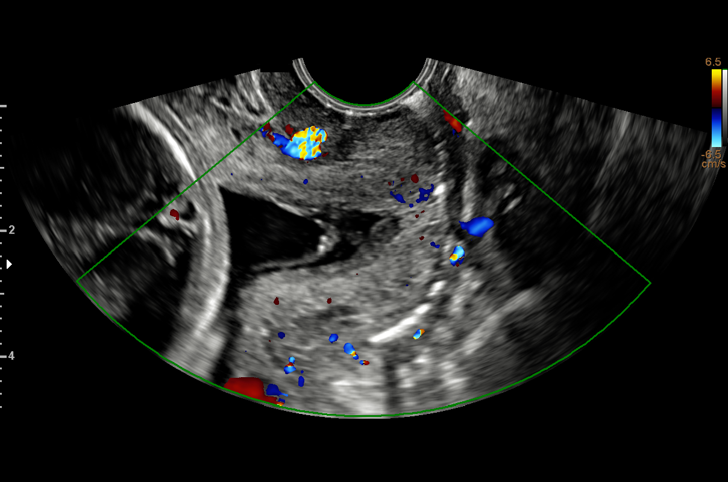

[Series 3: us mfm ob transvaginal · 12 acquisitions, 1 frame shown (2 of 2)]
[im 1/12]
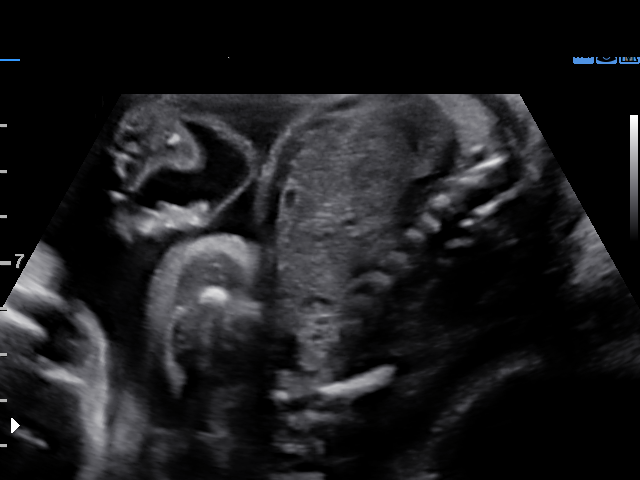

[12 of 28 positions shown; findings below may reference images not displayed]

OB/Gyn Clinic

WK

1  [place]            [id number]      [id number]     [id number]
2  [place]            [id number]      [id number]     [id number]
3  GRACY CHAN            [id number]      [id number]     [id number]
Indications

25 weeks gestation of pregnancy
Encounter for fetal anatomic survey
Short interval between pregancies, 2nd
trimester (SVD 07/21/16)
Twin pregnancy, di/di, second trimester
Cervical shortening, second trimester
OB History

Gravidity:    2         Term:   1        Prem:   0        SAB:   0
TOP:          0       Ectopic:  0        Living: 1
Fetal Evaluation (Fetus A)

Num Of Fetuses:     2
Fetal Heart         148
Rate(bpm):
Cardiac Activity:   Observed
Fetal Lie:          Lower Maternal Left
Presentation:       Cephalic
Placenta:           Anterior, above cervical os
P. Cord Insertion:  Visualized
Membrane Desc:      Dividing Membrane seen - Dichorionic.
Amniotic Fluid
AFI FV:      Subjectively within normal limits

Largest Pocket(cm)
4.18
Biometry (Fetus A)

BPD:      66.3  mm     G. Age:  26w 5d         92  %    CI:        76.65   %    70 - 86
FL/HC:       18.9  %    18.7 -
HC:      239.9  mm     G. Age:  26w 1d         67  %    HC/AC:       1.09       1.04 -
AC:        221  mm     G. Age:  26w 4d         84  %    FL/BPD:      68.5  %    71 - 87
FL:       45.4  mm     G. Age:  25w 0d         38  %    FL/AC:       20.5  %    20 - 24
HUM:      42.4  mm     G. Age:  25w 3d         55  %

Est. FW:     875   gm    1 lb 15 oz     73  %     FW Discordancy      0 \ 8 %
Gestational Age (Fetus A)

LMP:           23w 4d        Date:  02/11/17                 EDD:   11/18/17
U/S Today:     26w 1d                                        EDD:   10/31/17
Best:          25w 0d     Det. By:  Previous Ultrasound      EDD:   11/08/17
(06/14/17)
Anatomy (Fetus A)

Cranium:               Appears normal         Aortic Arch:            Appears normal
Cavum:                 Appears normal         Ductal Arch:            Appears normal
Ventricles:            Appears normal         Diaphragm:              Appears normal
Choroid Plexus:        Appears normal         Stomach:                Appears normal, left
sided
Cerebellum:            Appears normal         Abdomen:                Appears normal
Posterior Fossa:       Appears normal         Abdominal Wall:         Appears nml (cord
insert, abd wall)
Nuchal Fold:           Not applicable (>20    Cord Vessels:           Appears normal (3
wks GA)                                        vessel cord)
Face:                  Appears normal         Kidneys:                Pelvic kidney - left
(orbits and profile)
Lips:                  Appears normal         Bladder:                Appears normal
Thoracic:              Appears normal         Spine:                  Appears normal
Heart:                 Appears normal         Upper Extremities:      Appears normal
(4CH, axis, and
situs)
RVOT:                  Appears normal         Lower Extremities:      Appears normal
LVOT:                  Appears normal

Other:  Fetus appears to be a male. Heels visualized. Nasal bone visualized.

Fetal Evaluation (Fetus B)

Num Of Fetuses:     2
Fetal Heart         142
Rate(bpm):
Cardiac Activity:   Observed
Fetal Lie:          Upper Fetus
Presentation:       Transverse, head to maternal left
Placenta:           Posterior, above cervical os
P. Cord Insertion:  Visualized
Membrane Desc:      Dividing Membrane seen - Dichorionic.
Amniotic Fluid
AFI FV:      Subjectively within normal limits

Largest Pocket(cm)
4.23
Biometry (Fetus B)

BPD:      64.7  mm     G. Age:  26w 1d         80  %    CI:        76.48   %    70 - 86
FL/HC:       19.5  %    18.7 -
HC:      234.4  mm     G. Age:  25w 3d         46  %    HC/AC:       1.12       1.04 -
AC:      210.1  mm     G. Age:  25w 4d         58  %    FL/BPD:      70.6  %    71 - 87
FL:       45.7  mm     G. Age:  25w 1d         41  %    FL/AC:       21.8  %    20 - 24
HUM:      41.2  mm     G. Age:  25w 0d         41  %

Est. FW:     809   gm    1 lb 13 oz     62  %     FW Discordancy         8  %
Gestational Age (Fetus B)

LMP:           23w 4d        Date:  02/11/17                 EDD:   11/18/17
U/S Today:     25w 4d                                        EDD:   11/04/17
Best:          25w 0d     Det. By:  Previous Ultrasound      EDD:   11/08/17
(06/14/17)
Anatomy (Fetus B)

Cranium:               Appears normal         Aortic Arch:            Appears normal
Cavum:                 Appears normal         Ductal Arch:            Appears normal
Ventricles:            Appears normal         Diaphragm:              Appears normal
Choroid Plexus:        Appears normal         Stomach:                Appears normal, left
sided
Cerebellum:            Appears normal         Abdomen:                Appears normal
Posterior Fossa:       Appears normal         Abdominal Wall:         Appears nml (cord
insert, abd wall)
Nuchal Fold:           Not applicable (>20    Cord Vessels:           Appears normal (3
wks GA)                                        vessel cord)
Face:                  Orbits nl; profile not Kidneys:                Appear normal
well visualized
Lips:                  Appears normal         Bladder:                Appears normal
Thoracic:              Appears normal         Spine:                  Appears normal
Heart:                 Appears normal         Upper Extremities:      Appears normal
(4CH, axis, and
situs)
RVOT:                  Appears normal         Lower Extremities:      Appears normal
LVOT:                  Appears normal

Other:  Fetus appears to be a female. Heels visualized. Technically difficult
due to fetal position.
Cervix Uterus Adnexa

Cervix
Length:            1.5  cm.
Funneling of internal os noted.

Uterus
No abnormality visualized.

Left Ovary
Not visualized.
Right Ovary
Not visualized.

Cul De Sac:   No free fluid seen.

Adnexa:       No abnormality visualized.
Impression

Dichorionic/diamniotic twin pregnancy at 25+0 weeks

[name]:
Left pelvic kidney
All other detailed fetal anatomy was seen and appeared
normal
Normal amniotic fluid volume
Measurements consistent with prior US; EFW at the 73rd
%tile

[name]:
Normal detailed fetal anatomy; limited views of profile
Normal amniotic fluid volume
Measurements consistent with prior US; EFW at the 62nd
%tile

EV views of cervix: funneling of internal os with distal closed
portion measuring 1.5 cms
Recommendations

Begin vaginal progesterone
CL in one week
Growth US in 4 weeks

## 2019-06-17 IMAGING — US US MFM OB TRANSVAGINAL
1 series · 15 of 27 positions shown · non-contrast
Comparison: none

[Series 1: us mfm ob transvaginal · 27 acquisitions, 15 frames shown]
[im 1/27]
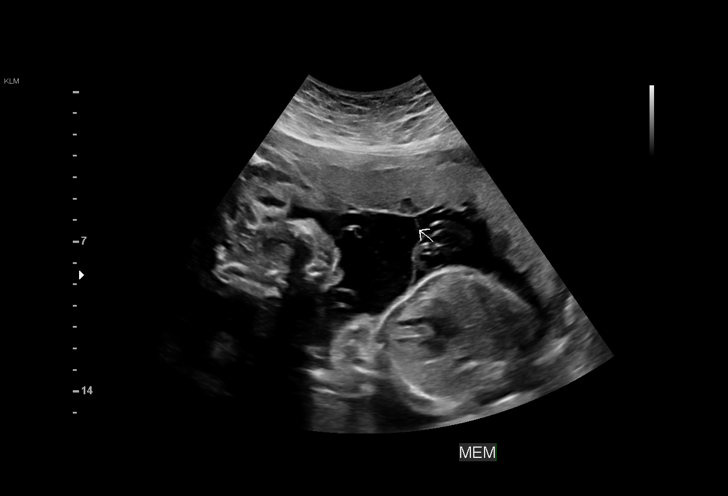
[im 3/27]
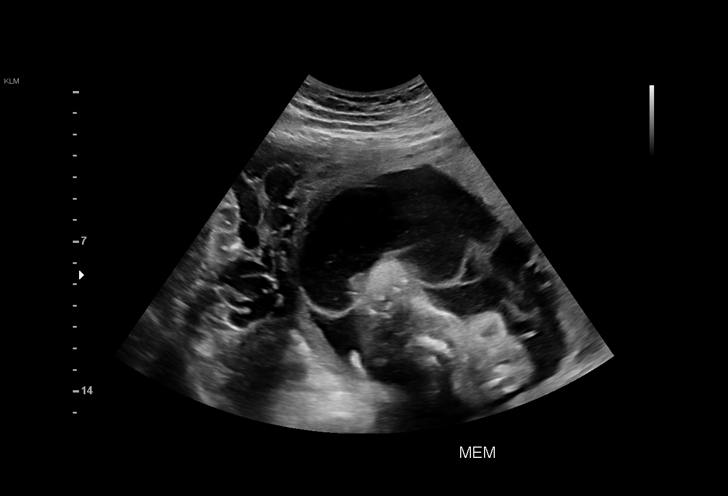
[im 5/27]
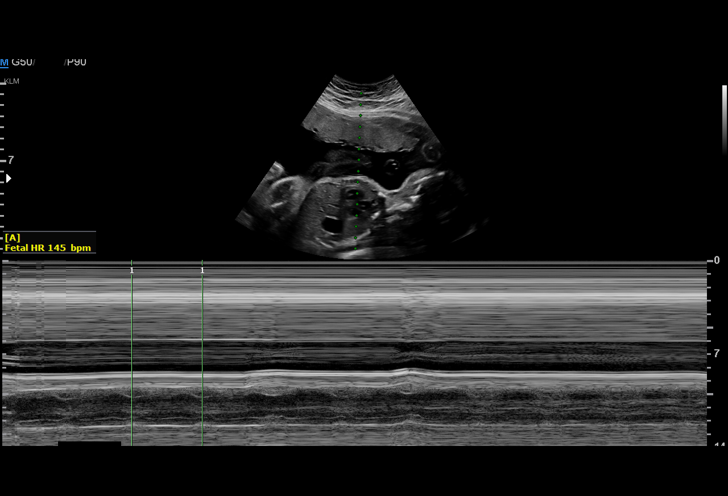
[im 7/27]
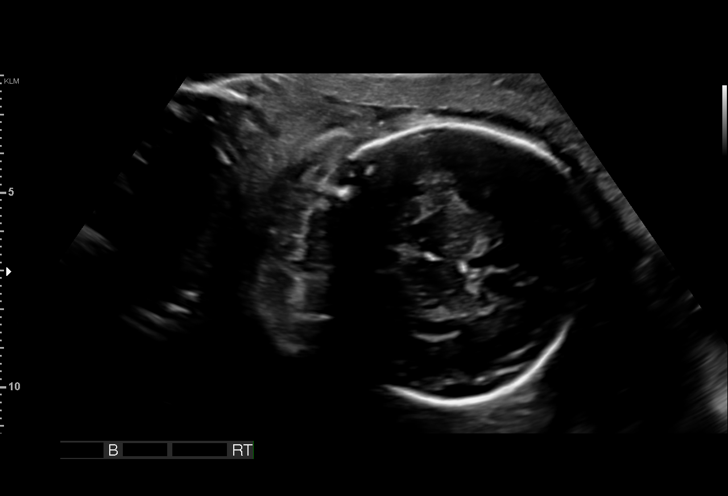
[im 9/27]
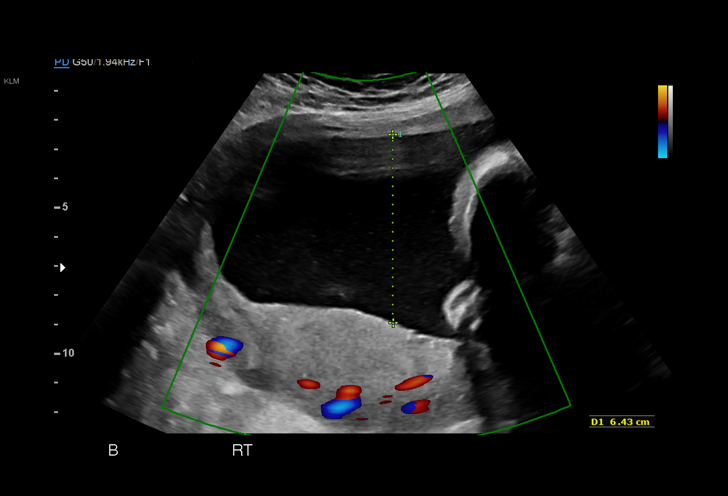
[im 10/27]
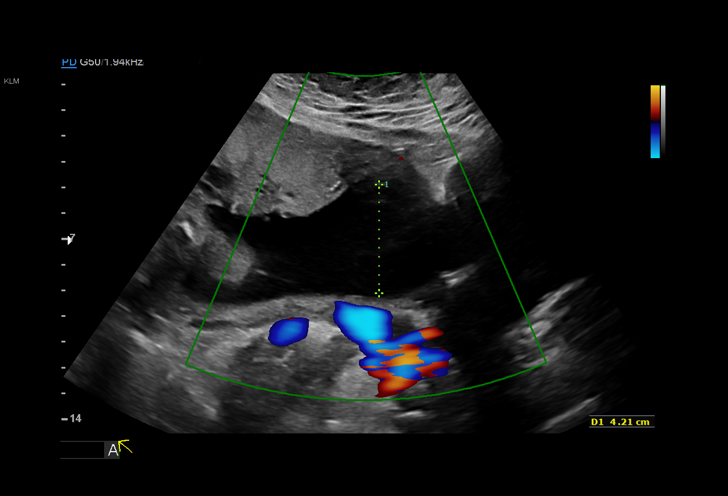
[im 12/27]
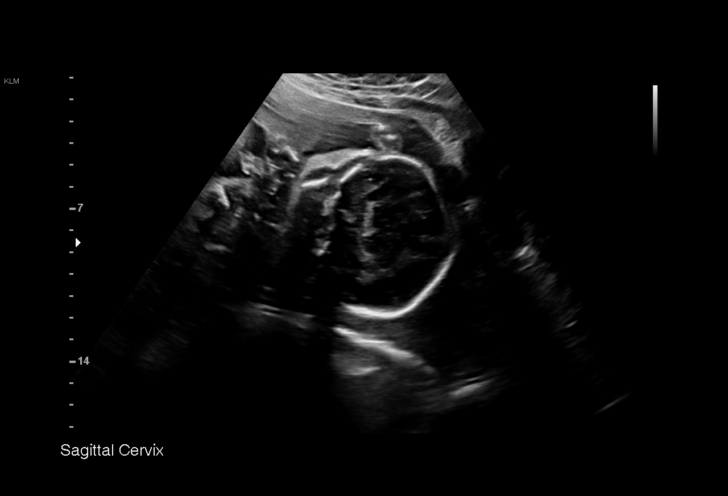
[im 14/27]
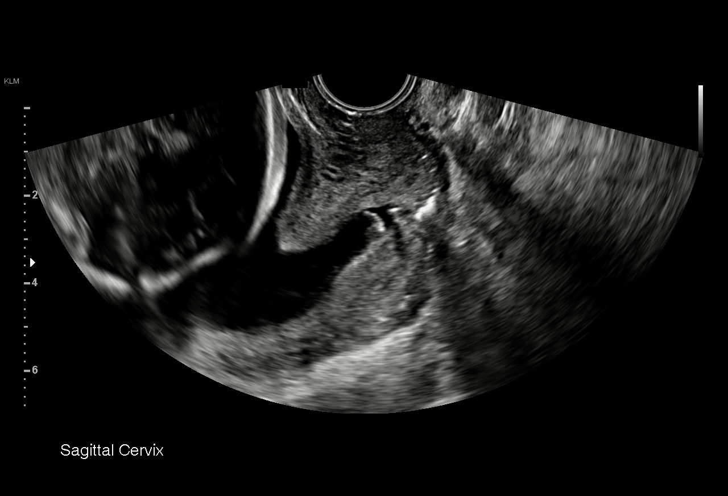
[im 16/27]
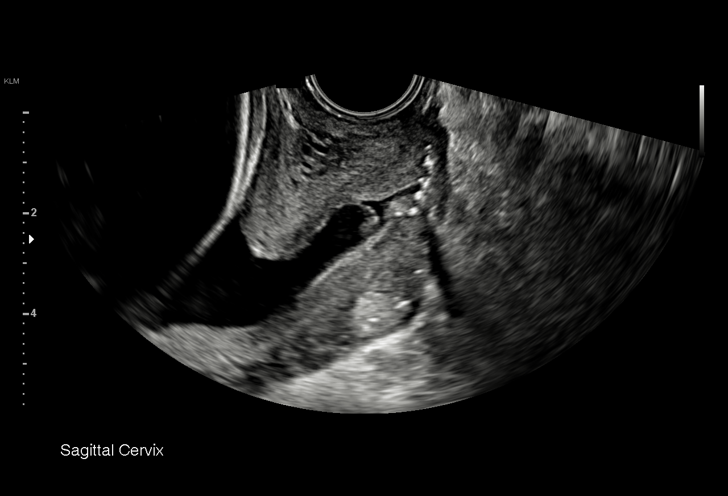
[im 18/27]
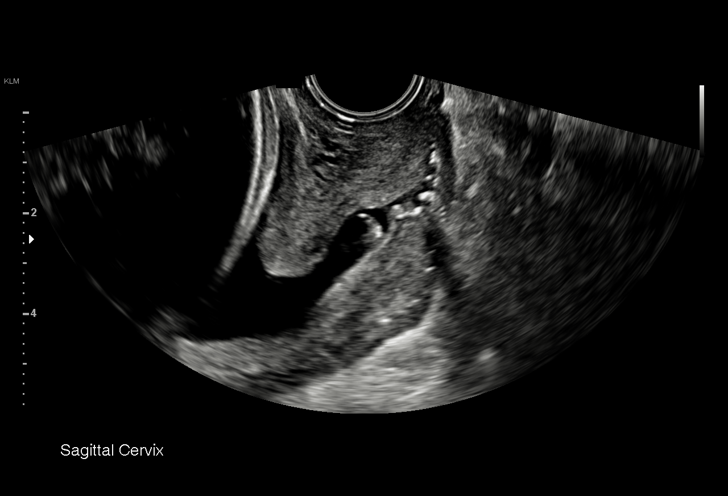
[im 19/27]
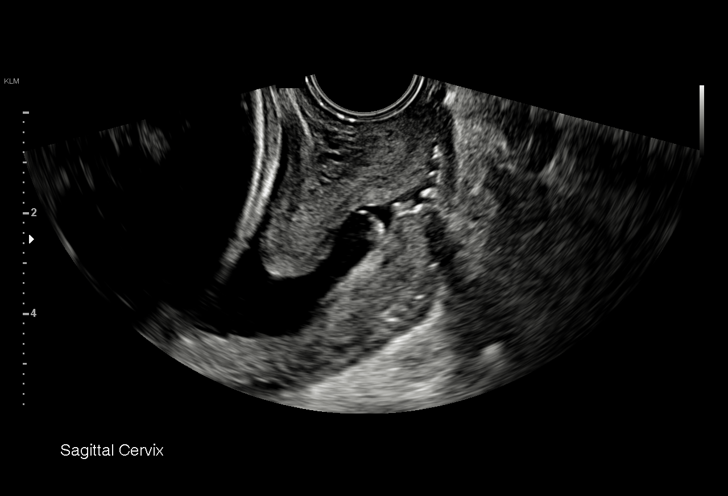
[im 21/27]
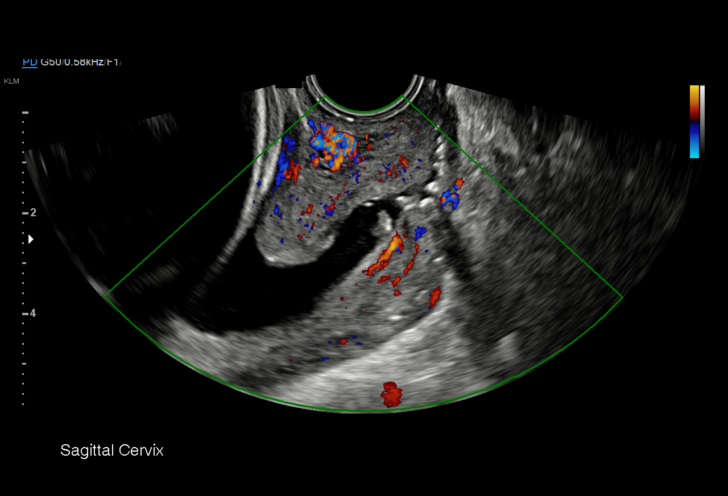
[im 23/27]
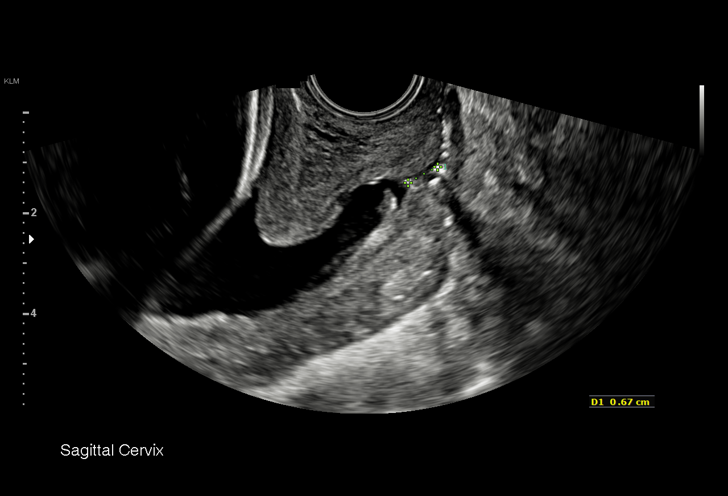
[im 25/27]
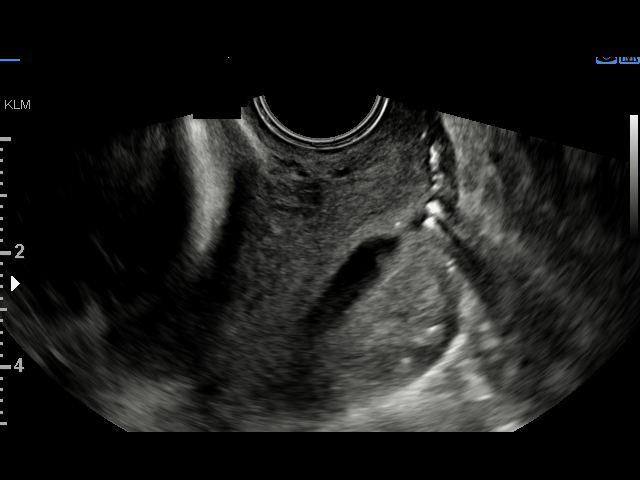
[im 27/27]
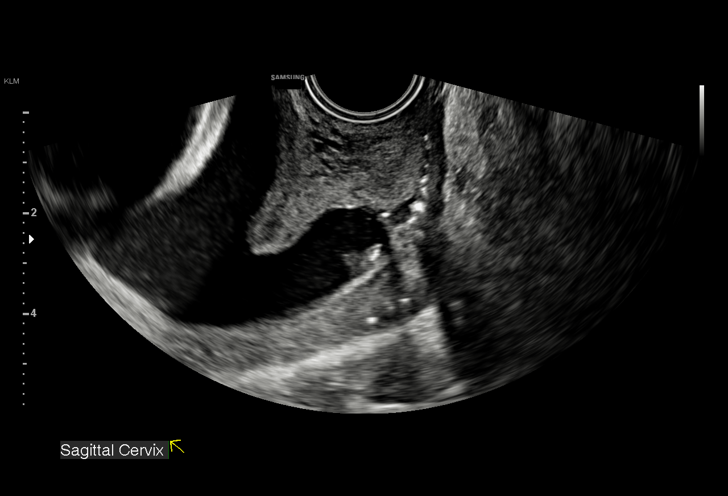

[15 of 27 positions shown; findings below may reference images not displayed]

OB/Gyn Clinic

1  [place]           [id number]      [id number]     [id number]
Indications

26 weeks gestation of pregnancy
Short interval between pregancies, 2nd
trimester (SVD 07/21/16)
Twin pregnancy, di/di, second trimester
Cervical shortening, second trimester
OB History

Gravidity:    2         Term:   1        Prem:   0        SAB:   0
TOP:          0       Ectopic:  0        Living: 1
Fetal Evaluation (Fetus A)

Num Of Fetuses:     2
Fetal Heart         145
Rate(bpm):
Cardiac Activity:   Observed
Fetal Lie:          Lower left fetus
Presentation:       Cephalic

Amniotic Fluid
AFI FV:      Subjectively within normal limits

Largest Pocket(cm)
4.2
Gestational Age (Fetus A)
LMP:           25w 1d        Date:  02/11/17                 EDD:   11/18/17
Best:          26w 4d     Det. By:  Previous Ultrasound      EDD:   11/08/17
(06/14/17)

Fetal Evaluation (Fetus B)

Num Of Fetuses:     2
Fetal Heart         165
Rate(bpm):
Cardiac Activity:   Observed
Fetal Lie:          Upper Right Fetus
Presentation:       Cephalic

Amniotic Fluid
AFI FV:      Subjectively within normal limits

Largest Pocket(cm)
6.4
Gestational Age (Fetus B)

LMP:           25w 1d        Date:  02/11/17                 EDD:   11/18/17
Best:          26w 4d     Det. By:  Previous Ultrasound      EDD:   11/08/17
(06/14/17)
Cervix Uterus Adnexa

Cervix
Length:            1.5  cm.
Measured transvaginally. Funneling of internal os noted.
Impression

Dichorionic/diamniotic twin intrauterine pregnancy at 26w 4d.
Cephalic presentation x 2.
Normal amniotic fluid volume x 2.
The cervix measures 1.5 cm transvaginally with funneling of
the internal os, unchanged in appearance from prior exam.
Recommendations

Continue vaginal progesterone.
No further ultrasounds for cervical length recommended.
Continue serial ultrasounds for fetal growth.

## 2021-04-19 ENCOUNTER — Encounter (HOSPITAL_BASED_OUTPATIENT_CLINIC_OR_DEPARTMENT_OTHER): Payer: Self-pay | Admitting: Emergency Medicine

## 2021-04-19 ENCOUNTER — Emergency Department (HOSPITAL_BASED_OUTPATIENT_CLINIC_OR_DEPARTMENT_OTHER)
Admission: EM | Admit: 2021-04-19 | Discharge: 2021-04-19 | Discharge status: Against Medical Advice | Payer: Medicaid Other | Attending: Emergency Medicine | Admitting: Emergency Medicine

## 2021-04-19 ENCOUNTER — Other Ambulatory Visit: Payer: Self-pay

## 2021-04-19 DIAGNOSIS — Z5321 Procedure and treatment not carried out due to patient leaving prior to being seen by health care provider: Secondary | ICD-10-CM | POA: Diagnosis not present

## 2021-04-19 DIAGNOSIS — N898 Other specified noninflammatory disorders of vagina: Secondary | ICD-10-CM | POA: Diagnosis not present

## 2021-04-19 NOTE — ED Triage Notes (Signed)
Pt reports vaginal dc and "I want to make sure I'm not pregnant"

## 2021-04-19 NOTE — ED Notes (Signed)
Upon finishing triage, pt decided to leave d/t "I only have an hour"; explained to pt that we cannot provide wait time estimations d/t nature of the department.

## 2021-04-20 ENCOUNTER — Emergency Department (HOSPITAL_COMMUNITY)
Admission: EM | Admit: 2021-04-20 | Discharge: 2021-04-20 | Disposition: A | Discharge status: Home / Self Care | Payer: Medicaid Other | Attending: Emergency Medicine | Admitting: Emergency Medicine

## 2021-04-20 ENCOUNTER — Encounter (HOSPITAL_COMMUNITY): Payer: Self-pay | Admitting: Emergency Medicine

## 2021-04-20 DIAGNOSIS — N898 Other specified noninflammatory disorders of vagina: Secondary | ICD-10-CM | POA: Diagnosis present

## 2021-04-20 DIAGNOSIS — N758 Other diseases of Bartholin's gland: Secondary | ICD-10-CM | POA: Insufficient documentation

## 2021-04-20 LAB — POC URINE PREG, ED: Preg Test, Ur: NEGATIVE

## 2021-04-20 NOTE — ED Triage Notes (Signed)
Pt reports vaginal discharge and swelling.

## 2021-04-20 NOTE — Discharge Instructions (Addendum)
Thank you for allowing me to care for you today in the Emergency Department.   Take sitz bath's 2-3 times a day for the next 3 to 5 days.  You can also apply a warm compress to the area to help with pain and swelling.  Take 650 mg of Tylenol or 600 mg of ibuprofen with food every 6 hours for pain.  You can alternate between these 2 medications every 3 hours if your pain returns.  For instance, you can take Tylenol at noon, followed by a dose of ibuprofen at 3, followed by second dose of Tylenol and 6.  Call to schedule follow-up appoint with your OB/GYN if your symptoms do not significantly proved with this regimen in the next 5 to 7 days.  Avoid sexual intercourse during this time.  Return to the emergency department if you become unable to urinate, develop uncontrollable vomiting, or other new, concerning symptoms.

## 2021-04-20 NOTE — ED Notes (Signed)
Patient verbalizes understanding of discharge instructions. Sitz bath explained. Follow-up care with OBGYN reviewed. Opportunity for questioning and answers were provided. Armband removed by staff, pt discharged from ED ambulatory.

## 2021-04-20 NOTE — ED Provider Notes (Signed)
[facility] EMERGENCY DEPARTMENT Provider Note   CSN: [id number] Arrival date & time: 04/20/21  0409     History Chief Complaint  Patient presents with   Exposure to STD    Tiffany Deleon is a 25 y.o. female who presents to the emergency department with a chief complaint of groin swelling and vaginal discharge.  The patient reports persistent right-sided groin pain and swelling that began shortly after having sexual intercourse 2 days ago.  She reports that she has noticed a small amount of vaginal discharge over the last 2 days.  No fever, chills, rash, vaginal itching, abdominal pain, nausea, vomiting, diarrhea.  No treatment prior to arrival.  Of note, she was seen at [facility] earlier in the day with concern for pregnancy, but left prior to completing her evaluation.  The history is provided by the patient and medical records. No language interpreter was used.      Past Medical History:  Diagnosis Date   Medical history non-contributory     Patient Active Problem List   Diagnosis Date Noted   Twin pregnancy delivered vaginally 09/07/2017   Preterm labor 09/06/2017   Supervision of high risk pregnancy, antepartum 07/14/2017   Pregnancy, twins 07/14/2017   Short interval between pregnancies affecting pregnancy, antepartum 07/14/2017   History of group B Streptococcus (GBS) infection 07/14/2017    Past Surgical History:  Procedure Laterality Date   NO PAST SURGERIES       OB History     Gravida  2   Para  2   Term  1   Preterm  1   AB  0   Living  3      SAB  0   IAB  0   Ectopic  0   Multiple  1   Live Births  3           No family history on file.  Social History   Tobacco Use   Smoking status: Never   Smokeless tobacco: Never  Substance Use Topics   Alcohol use: No   Drug use: Not Currently    Home Medications Prior to Admission medications   Medication Sig Start Date End Date Taking? Authorizing  Provider  ibuprofen (ADVIL,MOTRIN) 600 MG tablet Take 1 tablet (600 mg total) by mouth every 6 (six) hours. 09/09/17   Lazaro Arms, MD  Prenatal Vit-Fe Fumarate-FA (PRENATAL VITAMIN PO) Take by mouth.    [provider]    Allergies    Patient has no known allergies.  Review of Systems   Review of Systems  Constitutional:  Negative for activity change, chills, diaphoresis and fever.  HENT:  Negative for congestion and sore throat.   Respiratory:  Negative for shortness of breath.   Cardiovascular:  Negative for chest pain.  Gastrointestinal:  Negative for abdominal pain, diarrhea, nausea and vomiting.  Genitourinary:  Positive for vaginal pain. Negative for difficulty urinating, dysuria, pelvic pain, urgency, vaginal bleeding and vaginal discharge.  Musculoskeletal:  Negative for back pain, myalgias, neck pain and neck stiffness.  Skin:  Negative for rash.  Allergic/Immunologic: Negative for immunocompromised state.  Neurological:  Negative for seizures, syncope, weakness, numbness and headaches.  Psychiatric/Behavioral:  Negative for confusion.    Physical Exam Updated Vital Signs BP 113/61   Pulse 72   Temp 98.7 F (37.1 C)   Resp 14   LMP  (LMP Unknown)   SpO2 100%   Physical Exam Vitals and nursing  note reviewed.  Constitutional:      General: She is not in acute distress. HENT:     Head: Normocephalic.  Eyes:     Conjunctiva/sclera: Conjunctivae normal.  Cardiovascular:     Rate and Rhythm: Normal rate and regular rhythm.     Heart sounds: No murmur heard.   No friction rub. No gallop.  Pulmonary:     Effort: Pulmonary effort is normal. No respiratory distress.     Breath sounds: No stridor. No wheezing, rhonchi or rales.  Chest:     Chest wall: No tenderness.  Abdominal:     General: There is no distension.     Palpations: Abdomen is soft. There is no mass.     Tenderness: There is no abdominal tenderness. There is no right CVA tenderness, left CVA  tenderness, guarding or rebound.     Hernia: No hernia is present.  Genitourinary:    Comments: Chaperoned exam.  There is swelling noted to the right Bartholin's gland.  There is a small amount of serosanguineous drainage.  No purulence.  Minimal tenderness to palpation. Musculoskeletal:     Cervical back: Neck supple.  Skin:    General: Skin is warm.     Findings: No rash.  Neurological:     Mental Status: She is alert.  Psychiatric:        Behavior: Behavior normal.    ED Results / Procedures / Treatments   Labs (all labs ordered are listed, but only abnormal results are displayed) Labs Reviewed  URINALYSIS, ROUTINE W REFLEX MICROSCOPIC  POC URINE PREG, ED    EKG None  Radiology No results found.  Procedures Procedures   Medications Ordered in ED Medications - No data to display  ED Course  I have reviewed the triage vital signs and the nursing notes.  Pertinent labs & imaging results that were available during my care of the patient were reviewed by me and considered in my medical decision making (see chart for details).    MDM Rules/Calculators/A&P                          25 year old female presenting with groin swelling and concern for vaginal discharge over the last 2 days.  Symptoms began shortly after having sexual intercourse.  She does also request to have a pregnancy test as there is Some concern for pregnancy.  No constitutional symptoms.  Vital signs are stable.  Labs have been reviewed and notably interpreted by me.  Pregnancy test is negative.  On exam, she appears to have a swelling over the right Bartholin's gland.  There does appear to be a small amount of serosanguineous drainage.  No indication for incision and drainage at this time.  She is having minimal tenderness.  Since she is having no abdominal or pelvic pain, there is no indication for pelvic exam.  I have a very low suspicion for TOA or PID.  Doubt ectopic pregnancy or miscarriage.  Will  discharge home with warm compresses and sitz bath's.  She can follow-up with OB/GYN if her symptoms do not significantly proved in the next few days.  ER return precautions given.  She is hemodynamically stable in no acute distress.  Safer discharge home with outpatient follow-up as discussed  Final Clinical Impression(s) / ED Diagnoses Final diagnoses:  Bartholinitis    Rx / DC Orders ED Discharge Orders     None        Nellene Courtois,  Kourtlyn Charlet A, PA-C 04/20/21 [id number]    Glynn Octave, MD 04/20/21 [phone]

## 2021-05-10 ENCOUNTER — Encounter (HOSPITAL_BASED_OUTPATIENT_CLINIC_OR_DEPARTMENT_OTHER): Payer: Self-pay | Admitting: *Deleted

## 2021-05-10 ENCOUNTER — Other Ambulatory Visit: Payer: Self-pay

## 2021-05-10 ENCOUNTER — Emergency Department (HOSPITAL_BASED_OUTPATIENT_CLINIC_OR_DEPARTMENT_OTHER)
Admission: EM | Admit: 2021-05-10 | Discharge: 2021-05-10 | Disposition: A | Discharge status: Home / Self Care | Payer: Medicaid Other | Attending: Emergency Medicine | Admitting: Emergency Medicine

## 2021-05-10 DIAGNOSIS — O219 Vomiting of pregnancy, unspecified: Secondary | ICD-10-CM | POA: Insufficient documentation

## 2021-05-10 DIAGNOSIS — Z3A00 Weeks of gestation of pregnancy not specified: Secondary | ICD-10-CM | POA: Insufficient documentation

## 2021-05-10 DIAGNOSIS — A599 Trichomoniasis, unspecified: Secondary | ICD-10-CM

## 2021-05-10 DIAGNOSIS — N9489 Other specified conditions associated with female genital organs and menstrual cycle: Secondary | ICD-10-CM | POA: Insufficient documentation

## 2021-05-10 DIAGNOSIS — Z3491 Encounter for supervision of normal pregnancy, unspecified, first trimester: Secondary | ICD-10-CM

## 2021-05-10 DIAGNOSIS — O23591 Infection of other part of genital tract in pregnancy, first trimester: Secondary | ICD-10-CM | POA: Insufficient documentation

## 2021-05-10 DIAGNOSIS — N390 Urinary tract infection, site not specified: Secondary | ICD-10-CM

## 2021-05-10 LAB — COMPREHENSIVE METABOLIC PANEL
ALT: 20 U/L (ref 0–44)
AST: 18 U/L (ref 15–41)
Albumin: 4.3 g/dL (ref 3.5–5.0)
Alkaline Phosphatase: 59 U/L (ref 38–126)
Anion gap: 9 (ref 5–15)
BUN: 9 mg/dL (ref 6–20)
CO2: 24 mmol/L (ref 22–32)
Calcium: 9.2 mg/dL (ref 8.9–10.3)
Chloride: 102 mmol/L (ref 98–111)
Creatinine, Ser: 0.59 mg/dL (ref 0.44–1.00)
GFR, Estimated: >60 mL/min (ref >60)
Glucose, Bld: 83 mg/dL (ref 70–99)
Potassium: 3.4 mmol/L — ABNORMAL LOW (ref 3.5–5.1)
Sodium: 135 mmol/L (ref 135–145)
Total Bilirubin: 0.5 mg/dL (ref 0.3–1.2)
Total Protein: 7.8 g/dL (ref 6.5–8.1)

## 2021-05-10 LAB — URINALYSIS, ROUTINE W REFLEX MICROSCOPIC
Bilirubin Urine: NEGATIVE
Glucose, UA: NEGATIVE mg/dL
Hgb urine dipstick: NEGATIVE
Ketones, ur: >80 mg/dL — AB
Nitrite: NEGATIVE
Protein, ur: NEGATIVE mg/dL
Specific Gravity, Urine: 1.02 (ref 1.005–1.030)
pH: 7 (ref 5.0–8.0)

## 2021-05-10 LAB — URINALYSIS, MICROSCOPIC (REFLEX)

## 2021-05-10 LAB — CBC
HCT: 35.6 % — ABNORMAL LOW (ref 36.0–46.0)
Hemoglobin: 12.4 g/dL (ref 12.0–15.0)
MCH: 34.6 pg — ABNORMAL HIGH (ref 26.0–34.0)
MCHC: 34.8 g/dL (ref 30.0–36.0)
MCV: 99.4 fL (ref 80.0–100.0)
Platelets: 300 10*3/uL (ref 150–400)
RBC: 3.58 MIL/uL — ABNORMAL LOW (ref 3.87–5.11)
RDW: 12 % (ref 11.5–15.5)
WBC: 8.8 10*3/uL (ref 4.0–10.5)
nRBC: 0 % (ref 0.0–0.2)

## 2021-05-10 LAB — WET PREP, GENITAL
Sperm: NONE SEEN
Yeast Wet Prep HPF POC: NONE SEEN

## 2021-05-10 LAB — LIPASE, BLOOD: Lipase: 28 U/L (ref 11–51)

## 2021-05-10 LAB — PREGNANCY, URINE: Preg Test, Ur: POSITIVE — AB

## 2021-05-10 LAB — HCG, QUANTITATIVE, PREGNANCY: hCG, Beta Chain, Quant, S: 101614 m[IU]/mL — ABNORMAL HIGH (ref <5)

## 2021-05-10 MED ORDER — ONDANSETRON 4 MG PO TBDP
4.0000 mg | ORAL_TABLET | Freq: Once | ORAL | Status: AC
  Administered 2021-05-10: 4 mg via ORAL
  Filled 2021-05-10: qty 1

## 2021-05-10 MED ORDER — CEPHALEXIN 500 MG PO CAPS: 500.0000 mg | ORAL_CAPSULE | Freq: Four times a day (QID) | ORAL | 0 refills | Status: DC

## 2021-05-10 MED ORDER — ONDANSETRON 4 MG PO TBDP: 4.0000 mg | ORAL_TABLET | Freq: Three times a day (TID) | ORAL | 0 refills | Status: DC | PRN

## 2021-05-10 MED ORDER — METRONIDAZOLE 500 MG PO TABS: 500.0000 mg | ORAL_TABLET | Freq: Two times a day (BID) | ORAL | 0 refills | Status: DC

## 2021-05-10 NOTE — ED Triage Notes (Signed)
Pt reports n/v for 2-3 days. States 3 or 4 episodes today with "a little diarrhea". Her bf has had similar Sx

## 2021-05-10 NOTE — Discharge Instructions (Addendum)
  You were evaluated in the Emergency Department and after careful evaluation, we did not find any emergent condition requiring admission or further testing in the hospital.   Your exam/testing today was overall reassuring.  Symptoms seem to be due to pregnancy.  As we discussed you need to follow-up with your GYN for your pregnancy, if you do not have a GYN you can follow-up with the clinic that I provided you with their packet as above.  Important that you do this and get a ultrasound as we discussed.  I also want you to use the attached instructions on pregnancy.  You also seem as if you have a urinary tract infection, please take antibiotics for this.  You also have trichomonas, this is a STD.  Did prescribe you antibiotics for this, do not drink alcohol when taking these antibiotics.  Please have your partner tested for that as well.  Do not have intercourse for the next 2 weeks.  Your gonorrhea chlamydia test will come back in the next couple of days, if they are positive please follow-up with your PCP to have these treated.  I also did prescribe you nausea medication which you can take for your nausea. Please return to the Emergency Department if you experience any worsening of your condition.  Thank you for allowing [place] to be a part of your care. Please speak to your pharmacist about any new medications prescribed today in regards to side effects or interactions with other medications.

## 2021-05-10 NOTE — ED Provider Notes (Signed)
[facility] EMERGENCY DEPARTMENT Provider Note   CSN: [id number] Arrival date & time: 05/10/21  2001     History Chief Complaint  Patient presents with   Emesis    Tiffany Deleon is a 25 y.o. female G3, P3 that presents to the emerge department today for nausea vomiting and diarrhea for the past week.  Denies any abdominal pain.  Patient states that her boyfriend has similar complaint.  States that her last menstrual cycle was 3 months ago, however is irregular.  Denies any urinary complaints or vaginal complaints.  No vaginal discharge.  States that she has been with the same partner for the past multiple months.  Denies any pain anywhere, no abdominal pain, chest pain, shortness of breath, fevers.  Does not think that she is pregnant.  No other complaints at this time.  No URI symptoms, myalgias.  No other complaints at this time.  HPI     Past Medical History:  Diagnosis Date   Medical history non-contributory     Patient Active Problem List   Diagnosis Date Noted   Twin pregnancy delivered vaginally 09/07/2017   Preterm labor 09/06/2017   Supervision of high risk pregnancy, antepartum 07/14/2017   Pregnancy, twins 07/14/2017   Short interval between pregnancies affecting pregnancy, antepartum 07/14/2017   History of group B Streptococcus (GBS) infection 07/14/2017    Past Surgical History:  Procedure Laterality Date   NO PAST SURGERIES       OB History     Gravida  2   Para  2   Term  1   Preterm  1   AB  0   Living  3      SAB  0   IAB  0   Ectopic  0   Multiple  1   Live Births  3           No family history on file.  Social History   Tobacco Use   Smoking status: Never   Smokeless tobacco: Never  Substance Use Topics   Alcohol use: No   Drug use: Not Currently    Home Medications Prior to Admission medications   Medication Sig Start Date End Date Taking? Authorizing Provider  cephALEXin (KEFLEX) 500 MG capsule Take 1  capsule (500 mg total) by mouth 4 (four) times daily. 05/10/21  Yes Farrel Gordon, PA-C  metroNIDAZOLE (FLAGYL) 500 MG tablet Take 1 tablet (500 mg total) by mouth 2 (two) times daily. 05/10/21  Yes Dafna Romo, PA-C  ondansetron (ZOFRAN ODT) 4 MG disintegrating tablet Take 1 tablet (4 mg total) by mouth every 8 (eight) hours as needed for nausea or vomiting. 05/10/21  Yes Farrel Gordon, PA-C  ibuprofen (ADVIL,MOTRIN) 600 MG tablet Take 1 tablet (600 mg total) by mouth every 6 (six) hours. 09/09/17   Lazaro Arms, MD  Prenatal Vit-Fe Fumarate-FA (PRENATAL VITAMIN PO) Take by mouth.    [provider]    Allergies    Patient has no known allergies.  Review of Systems   Review of Systems  Constitutional:  Negative for chills, diaphoresis, fatigue and fever.  HENT:  Negative for congestion, sore throat and trouble swallowing.   Eyes:  Negative for pain and visual disturbance.  Respiratory:  Negative for cough, shortness of breath and wheezing.   Cardiovascular:  Negative for chest pain, palpitations and leg swelling.  Gastrointestinal:  Positive for diarrhea, nausea and vomiting. Negative for abdominal distention and abdominal pain.  Genitourinary:  Negative  for difficulty urinating.  Musculoskeletal:  Negative for back pain, neck pain and neck stiffness.  Skin:  Negative for pallor.  Neurological:  Negative for dizziness, speech difficulty, weakness and headaches.  Psychiatric/Behavioral:  Negative for confusion.    Physical Exam Updated Vital Signs BP 112/71   Pulse 86   Temp 98.4 F (36.9 C) (Oral)   Resp 16   Ht 5\' 5"  (1.651 m)   Wt 56.7 kg   LMP  (LMP Unknown)   SpO2 100%   BMI 20.80 kg/m   Physical Exam Constitutional:      General: She is not in acute distress.    Appearance: Normal appearance. She is not ill-appearing, toxic-appearing or diaphoretic.  HENT:     Mouth/Throat:     Mouth: Mucous membranes are moist.     Pharynx: Oropharynx is clear.  Eyes:      General: No scleral icterus.    Extraocular Movements: Extraocular movements intact.     Pupils: Pupils are equal, round, and reactive to light.  Cardiovascular:     Rate and Rhythm: Normal rate and regular rhythm.     Pulses: Normal pulses.     Heart sounds: Normal heart sounds.  Pulmonary:     Effort: Pulmonary effort is normal. No respiratory distress.     Breath sounds: Normal breath sounds. No stridor. No wheezing, rhonchi or rales.  Chest:     Chest wall: No tenderness.  Abdominal:     General: Abdomen is flat. There is no distension.     Palpations: Abdomen is soft.     Tenderness: There is no abdominal tenderness. There is no guarding or rebound.  Genitourinary:    Comments: Chaperone present.  Patient unable to tolerate pelvic exam at this time, introitus is extremely tender to palpation.  Vaginal canal is also tender when I try and insert speculum.  Unable to see cervix.  Did attempt this twice with smaller speculum as well.  While obtaining swabs, patient states that this also hurts her.  Unable to perform bimanual due to tenderness to palpation. Musculoskeletal:        General: No swelling or tenderness. Normal range of motion.     Cervical back: Normal range of motion and neck supple. No rigidity.     Right lower leg: No edema.     Left lower leg: No edema.  Skin:    General: Skin is warm and dry.     Capillary Refill: Capillary refill takes less than 2 seconds.     Coloration: Skin is not pale.  Neurological:     General: No focal deficit present.     Mental Status: She is alert and oriented to person, place, and time.  Psychiatric:        Mood and Affect: Mood normal.        Behavior: Behavior normal.    ED Results / Procedures / Treatments   Labs (all labs ordered are listed, but only abnormal results are displayed) Labs Reviewed  WET PREP, GENITAL - Abnormal; Notable for the following components:      Result Value   Trich, Wet Prep PRESENT (*)    Clue Cells  Wet Prep HPF POC PRESENT (*)    WBC, Wet Prep HPF POC MANY (*)    All other components within normal limits  URINALYSIS, ROUTINE W REFLEX MICROSCOPIC - Abnormal; Notable for the following components:   APPearance CLOUDY (*)    Ketones, ur >80 (*)  Leukocytes,Ua LARGE (*)    All other components within normal limits  PREGNANCY, URINE - Abnormal; Notable for the following components:   Preg Test, Ur POSITIVE (*)    All other components within normal limits  URINALYSIS, MICROSCOPIC (REFLEX) - Abnormal; Notable for the following components:   Bacteria, UA MANY (*)    Trichomonas, UA PRESENT (*)    All other components within normal limits  HCG, QUANTITATIVE, PREGNANCY - Abnormal; Notable for the following components:   hCG, Beta Chain, Quant, S 101,614 (*)    All other components within normal limits  CBC - Abnormal; Notable for the following components:   RBC 3.58 (*)    HCT 35.6 (*)    MCH 34.6 (*)    All other components within normal limits  COMPREHENSIVE METABOLIC PANEL - Abnormal; Notable for the following components:   Potassium 3.4 (*)    All other components within normal limits  URINE CULTURE  LIPASE, BLOOD  GC/CHLAMYDIA PROBE AMP ([place]) NOT AT [facility]    EKG None  Radiology No results found.  Procedures Procedures   Medications Ordered in ED Medications  ondansetron (ZOFRAN-ODT) disintegrating tablet 4 mg (4 mg Oral Given 05/10/21 2256)    ED Course  I have reviewed the triage vital signs and the nursing notes.  Pertinent labs & imaging results that were available during my care of the patient were reviewed by me and considered in my medical decision making (see chart for details).    MDM Rules/Calculators/A&P                           Patient presents emerged department today for nausea vomiting for the past week.  Pregnancy test positive, urinalysis showing signs of infection with trichomonas present.  Patient did not know that she was pregnant,  will obtain basic lab work at this time however suspicion that nausea vomiting most likely related to pregnancy.  Patient without any abdominal pain, or pelvic pain per patient, however when doing pelvic exam patient is having extreme pain to introitus, unable to see cervix due to patient being unable to tolerate exam..  Introitus does not appear tender or swollen, patient did recently did have intercourse.  Did offer patient pain medication and then to reattempt, however patient does not want to do this.  Do not think that patient will able to tolerate pelvic ultrasound and unfortunately unable to obtain CT due to patient's pregnancy status.  Unfortunately we do not have pelvic ultrasound here at this time anyway, did offer transfer to other [facility], however patient rejected and states she has an ultrasound scheduled with her GYN next week.  Wet prep with trichomonas and clue cells.  Flagyl provided for patient to pick up.  hCG 101,000, patient will follow up with GYN.  GC testing pending.  White count normal, do not favor PID at this time, introitus was tender causing this to be unable to do exam.  Patient does not have a white count and is afebrile and not tachycardic here for will have patient follow-up with GC testing.  Patient was not complaining of pelvic pain prior to coming in, primarily complaining of nausea and vomiting.  Patient without any abdominal pain.  No abdominal tenderness.  Patient appears extremely well, has not vomited while being here.  Did discuss this with my attending, Dr. Rubin Payor who does not think that we need to treat for PID at this time and  patient will follow up with GYN.  States that she has an appointment coming up, to be discharged at this time.  Discussed side effects of medications.  Doubt need for further emergent work up at this time. I explained the diagnosis and have given explicit precautions to return to the ER including for any other new or worsening symptoms.  The patient understands and accepts the medical plan as it's been dictated and I have answered their questions. Discharge instructions concerning home care and prescriptions have been given. The patient is STABLE and is discharged to home in good condition.  I discussed this case with my attending physician who cosigned this note including patient's presenting symptoms, physical exam, and planned diagnostics and interventions. Attending physician stated agreement with plan or made changes to plan which were implemented.   Final Clinical Impression(s) / ED Diagnoses Final diagnoses:  Trichomoniasis  Lower urinary tract infectious disease  First trimester pregnancy    Rx / DC Orders ED Discharge Orders          Ordered    metroNIDAZOLE (FLAGYL) 500 MG tablet  2 times daily        05/10/21 2234    cephALEXin (KEFLEX) 500 MG capsule  4 times daily        05/10/21 2234    ondansetron (ZOFRAN ODT) 4 MG disintegrating tablet  Every 8 hours PRN        05/10/21 2245             Farrel Gordonatel, Suly Vukelich, PA-C 05/10/21 2315    Benjiman CorePickering, Nathan, MD 05/11/21 0002

## 2021-05-12 LAB — GC/CHLAMYDIA PROBE AMP (~~LOC~~) NOT AT ARMC
Chlamydia: NEGATIVE
Comment: NEGATIVE
Comment: NORMAL
Neisseria Gonorrhea: NEGATIVE

## 2021-05-12 LAB — URINE CULTURE: Culture: NO GROWTH

## 2021-05-27 ENCOUNTER — Telehealth (INDEPENDENT_AMBULATORY_CARE_PROVIDER_SITE_OTHER): Payer: Medicaid Other

## 2021-05-27 DIAGNOSIS — Z136 Encounter for screening for cardiovascular disorders: Secondary | ICD-10-CM

## 2021-05-27 DIAGNOSIS — O0990 Supervision of high risk pregnancy, unspecified, unspecified trimester: Secondary | ICD-10-CM | POA: Insufficient documentation

## 2021-05-27 DIAGNOSIS — Z3480 Encounter for supervision of other normal pregnancy, unspecified trimester: Secondary | ICD-10-CM

## 2021-05-27 DIAGNOSIS — Z3A00 Weeks of gestation of pregnancy not specified: Secondary | ICD-10-CM

## 2021-05-27 MED ORDER — BLOOD PRESSURE MONITORING DEVI: 1.0000 | 0 refills | Status: DC

## 2021-05-27 NOTE — Progress Notes (Signed)
New OB Intake  I connected with  Tiffany Deleon on 05/27/21 at  9:15 AM EDT by telephone Video Visit and verified that I am speaking with the correct person using two identifiers. Nurse is located at [facility] and pt is located at home.  I discussed the limitations, risks, security and privacy concerns of performing an evaluation and management service by telephone and the availability of in person appointments. I also discussed with the patient that there may be a patient responsible charge related to this service. The patient expressed understanding and agreed to proceed.  I explained I am completing New OB Intake today. We discussed her EDD of 12/30/21 that is based on [place] on  05/15/21. Pt is G3/P1. I reviewed her allergies, medications, Medical/Surgical/OB history, and appropriate screenings. I informed her of [facility] services. Based on history, this is a/an  pregnancy uncomplicated .   Patient Active Problem List   Diagnosis Date Noted   Supervision of other normal pregnancy, antepartum 05/27/2021   Twin pregnancy delivered vaginally 09/07/2017   Preterm labor 09/06/2017   Supervision of high risk pregnancy, antepartum 07/14/2017   Pregnancy, twins 07/14/2017   Short interval between pregnancies affecting pregnancy, antepartum 07/14/2017   History of group B Streptococcus (GBS) infection 07/14/2017    Concerns addressed today  Delivery Plans:  Plans to deliver at [facility].   MyChart/Babyscripts MyChart access verified. I explained pt will have some visits in office and some virtually. Babyscripts instructions given and order placed. Patient verifies receipt of registration text/e-mail. Account successfully created and app downloaded.  Blood Pressure Cuff  Blood pressure cuff ordered for patient to pick-up from [employer]. Explained after first prenatal appt pt will check weekly and document in Babyscripts.  Weight scale: Patient    have weight scale. Weight scale ordered for patient to  pick up form [facility].   Anatomy US Explained first scheduled [place] will be around 19 weeks. Anatomy US scheduled for 08/06/21 at 10:15. Pt notified to arrive at 10:00.  Labs Discussed Avelina Laine genetic screening with patient. Would like both Panorama and Horizon drawn at new OB visit. Routine prenatal labs needed.  Covid Vaccine Patient has not covid vaccine.   Mother/ Baby Dyad Candidate?    If yes, offer as possibility  Informed patient of [facility] website  and placed link in her AVS.   Social Determinants of Health Food Insecurity: Patient denies food insecurity. WIC Referral: Patient is interested in referral to [facility].  Transportation: Patient denies transportation needs. Childcare: Discussed no children allowed at ultrasound appointments. Offered childcare services; patient declines childcare services at this time.  Send link to Pregnancy Navigators   Placed OB Box on problem list and updated  First visit review I reviewed new OB appt with pt. I explained she will have a pelvic exam, ob bloodwork with genetic screening, and PAP smear. Explained pt will be seen by Dr. Crissie Reese at first visit; encounter routed to appropriate provider. Explained that patient will be seen by pregnancy navigator following visit with provider. [facility] information placed in AVS.   Tiffany Deleon, CMA 05/27/2021  9:52 AM

## 2021-06-13 ENCOUNTER — Other Ambulatory Visit (HOSPITAL_COMMUNITY)
Admission: RE | Admit: 2021-06-13 | Discharge: 2021-06-13 | Disposition: A | Discharge status: Home / Self Care | Payer: Medicaid Other | Source: Ambulatory Visit | Attending: Family Medicine | Admitting: Family Medicine

## 2021-06-13 ENCOUNTER — Other Ambulatory Visit: Payer: Self-pay

## 2021-06-13 ENCOUNTER — Encounter: Payer: Self-pay | Admitting: Family Medicine

## 2021-06-13 ENCOUNTER — Ambulatory Visit (INDEPENDENT_AMBULATORY_CARE_PROVIDER_SITE_OTHER): Payer: Medicaid Other | Admitting: Family Medicine

## 2021-06-13 VITALS — BP 113/76 | HR 108 | Wt 141.3 lb

## 2021-06-13 DIAGNOSIS — Z124 Encounter for screening for malignant neoplasm of cervix: Secondary | ICD-10-CM | POA: Diagnosis present

## 2021-06-13 DIAGNOSIS — O0990 Supervision of high risk pregnancy, unspecified, unspecified trimester: Secondary | ICD-10-CM | POA: Diagnosis present

## 2021-06-13 DIAGNOSIS — Z8751 Personal history of pre-term labor: Secondary | ICD-10-CM

## 2021-06-13 DIAGNOSIS — Z5941 Food insecurity: Secondary | ICD-10-CM

## 2021-06-13 DIAGNOSIS — O219 Vomiting of pregnancy, unspecified: Secondary | ICD-10-CM

## 2021-06-13 MED ORDER — PROMETHAZINE HCL 25 MG PO TABS
25.0000 mg | ORAL_TABLET | Freq: Four times a day (QID) | ORAL | 2 refills | Status: DC | PRN
Start: 2021-06-13 — End: 2021-08-20

## 2021-06-13 MED ORDER — SCOPOLAMINE 1 MG/3DAYS TD PT72: 1.0000 | MEDICATED_PATCH | TRANSDERMAL | 12 refills | Status: DC

## 2021-06-13 MED ORDER — PROMETHAZINE HCL 25 MG RE SUPP: 25.0000 mg | Freq: Four times a day (QID) | RECTAL | 0 refills | Status: DC | PRN

## 2021-06-13 MED ORDER — ONDANSETRON 4 MG PO TBDP: 4.0000 mg | ORAL_TABLET | Freq: Three times a day (TID) | ORAL | 2 refills | Status: DC | PRN

## 2021-06-13 NOTE — Progress Notes (Signed)
Subjective:   Tiffany Deleon is a 25 y.o. [id number] at [redacted]w[redacted]d by uncertain LMP being seen today for her first obstetrical visit.  Her obstetrical history is significant for  preterm twin delivery at 31 weeks . Patient does intend to breast feed. Pregnancy history fully reviewed.  Patient reports nausea. Significantly affecting her work. Would like accomodation letter so that she can work from home. Zofran has not been effective.   HISTORY: OB History  Gravida Para Term Preterm AB Living  3 2 1 1  0 3  SAB IAB Ectopic Multiple Live Births  0 0 0 1 3    # Outcome Date GA Lbr Len/2nd Weight Sex Delivery Anes PTL Lv  3 Current           2A Preterm 09/07/17 [redacted]w[redacted]d 08:20 / 04:46 3 lb 4.2 oz (1.48 kg) M Vag-Spont EPI  LIV     Name: Kawasaki,BOYA Melannie     Apgar1: 8  Apgar5: 9  2B Preterm 09/07/17 [redacted]w[redacted]d 08:20 / 05:19 3 lb 10.6 oz (1.66 kg) F Vag-Spont EPI  LIV     Name: Chittick,GIRLB Brecken     Apgar1: 2  Apgar5: 4  1 Term 07/21/16 [redacted]w[redacted]d 03:29 / 00:32 6 lb 11.8 oz (3.055 kg) F Vag-Spont None  LIV     Name: Kimbrough,GIRL Sharyn     Apgar1: 8  Apgar5: 9     Last pap smear: No results found for: DIAGPAP, HPV, HPVHIGH NILM 2018  Past Medical History:  Diagnosis Date   Medical history non-contributory    Past Surgical History:  Procedure Laterality Date   NO PAST SURGERIES     History reviewed. No pertinent family history. Social History   Tobacco Use   Smoking status: Never   Smokeless tobacco: Never  Substance Use Topics   Alcohol use: No   Drug use: Not Currently   No Known Allergies Current Outpatient Medications on File Prior to Visit  Medication Sig Dispense Refill   ondansetron (ZOFRAN ODT) 4 MG disintegrating tablet Take 1 tablet (4 mg total) by mouth every 8 (eight) hours as needed for nausea or vomiting. 20 tablet 0   Prenatal Vit-Fe Fumarate-FA (PRENATAL VITAMIN PO) Take by mouth.     Blood Pressure Monitoring DEVI 1 each by Does not apply route once a week. (Patient not  taking: Reported on 06/13/2021) 1 each 0   cephALEXin (KEFLEX) 500 MG capsule Take 1 capsule (500 mg total) by mouth 4 (four) times daily. (Patient not taking: No sig reported) 20 capsule 0   ibuprofen (ADVIL,MOTRIN) 600 MG tablet Take 1 tablet (600 mg total) by mouth every 6 (six) hours. (Patient not taking: Reported on 06/13/2021) 30 tablet 0   metroNIDAZOLE (FLAGYL) 500 MG tablet Take 1 tablet (500 mg total) by mouth 2 (two) times daily. (Patient not taking: No sig reported) 14 tablet 0   No current facility-administered medications on file prior to visit.     Exam   Vitals:   06/13/21 1001  BP: 113/76  Pulse: (!) 108  Weight: 141 lb 4.8 oz (64.1 kg)   Fetal Heart Rate (bpm): 156  Uterus:     Pelvic Exam: Perineum: no hemorrhoids, normal perineum   Vulva: normal external genitalia, no lesions   Vagina:  normal mucosa, copious yellow green discharge   Cervix: no lesions and normal, pap smear done.   System: General: well-developed, well-nourished female in no acute distress   Skin: normal coloration and turgor, no rashes  Neurologic: oriented, normal, negative, normal mood   Extremities: normal strength, tone, and muscle mass, ROM of all joints is normal   HEENT PERRLA, extraocular movement intact and sclera clear, anicteric   Neck supple and no masses   Respiratory:  no respiratory distress      Assessment:   Pregnancy: [id number] Patient Active Problem List   Diagnosis Date Noted   Supervision of high risk pregnancy, antepartum 05/27/2021   Twin pregnancy delivered vaginally 09/07/2017   History of preterm delivery 09/06/2017   Pregnancy, twins 07/14/2017   Short interval between pregnancies affecting pregnancy, antepartum 07/14/2017   History of group B Streptococcus (GBS) infection 07/14/2017     Plan:  1. Supervision of high risk pregnancy, antepartum Initial labs drawn. Due for pap, collected Trial new meds for nausea Work note per front desk protocol Continue  prenatal vitamins. Genetic Screening discussed, NIPS: ordered. Ultrasound discussed; fetal anatomic survey: ordered. Problem list reviewed and updated. The nature of [facility] with multiple MDs and other Advanced Practice Providers was explained to patient; also emphasized that residents, students are part of our team.  2. History of preterm delivery Recommended prophylactic cerclage given history of cervical insufficiency and preterm delivery at 31 weeks, despite it being a twin gestation Given uncertain dates will get TVUS asap and confirm dates first before scheduling  3. Trichomonas Patient declines treatment at present, will send confirmatory swab  Routine obstetric precautions reviewed. Return in 4 weeks (on 07/11/2021) for [facility], ob visit.

## 2021-06-13 NOTE — Patient Instructions (Signed)
Second Trimester of Pregnancy °The second trimester of pregnancy is from week 13 through week 27. This is months 4 through 6 of pregnancy. The second trimester is often a time when you feel your best. Your body has adjusted to being pregnant, and you begin to feel better physically. °During the second trimester: °Morning sickness has lessened or stopped completely. °You may have more energy. °You may have an increase in appetite. °The second trimester is also a time when the unborn baby (fetus) is growing rapidly. At the end of the sixth month, the fetus may be up to 12 inches long and weigh about 1½ pounds. You will likely begin to feel the baby move (quickening) between 16 and 20 weeks of pregnancy. °Body changes during your second trimester °Your body continues to go through many changes during your second trimester. The changes vary and generally return to normal after the baby is born. °Physical changes °Your weight will continue to increase. You will notice your lower abdomen bulging out. °You may begin to get stretch marks on your hips, abdomen, and breasts. °Your breasts will continue to grow and to become tender. °Dark spots or blotches (chloasma or mask of pregnancy) may develop on your face. °A dark line from your belly button to the pubic area (linea nigra) may appear. °You may have changes in your hair. These can include thickening of your hair, rapid growth, and changes in texture. Some people also have hair loss during or after pregnancy, or hair that feels dry or thin. °Health changes °You may develop headaches. °You may have heartburn. °You may develop constipation. °You may develop hemorrhoids or swollen, bulging veins (varicose veins). °Your gums may bleed and may be sensitive to brushing and flossing. °You may urinate more often because the fetus is pressing on your bladder. °You may have back pain. This is caused by: °Weight gain. °Pregnancy hormones that are relaxing the joints in your  pelvis. °A shift in weight and the muscles that support your balance. °Follow these instructions at home: °Medicines °Follow your health care provider's instructions regarding medicine use. Specific medicines may be either safe or unsafe to take during pregnancy. Do not take any medicines unless approved by your health care provider. °Take a prenatal vitamin that contains at least 600 micrograms (mcg) of folic acid. °Eating and drinking °Eat a healthy diet that includes fresh fruits and vegetables, whole grains, good sources of protein such as meat, eggs, or tofu, and low-fat dairy products. °Avoid raw meat and unpasteurized juice, milk, and cheese. These carry germs that can harm you and your baby. °You may need to take these actions to prevent or treat constipation: °Drink enough fluid to keep your urine pale yellow. °Eat foods that are high in fiber, such as beans, whole grains, and fresh fruits and vegetables. °Limit foods that are high in fat and processed sugars, such as fried or sweet foods. °Activity °Exercise only as directed by your health care provider. Most people can continue their usual exercise routine during pregnancy. Try to exercise for 30 minutes at least 5 days a week. Stop exercising if you develop contractions in your uterus. °Stop exercising if you develop pain or cramping in the lower abdomen or lower back. °Avoid exercising if it is very hot or humid or if you are at a high altitude. °Avoid heavy lifting. °If you choose to, you may have sex unless your health care provider tells you not to. °Relieving pain and discomfort °Wear a supportive bra   to prevent discomfort from breast tenderness. °Take warm sitz baths to soothe any pain or discomfort caused by hemorrhoids. Use hemorrhoid cream if your health care provider approves. °Rest with your legs raised (elevated) if you have leg cramps or low back pain. °If you develop varicose veins: °Wear support hose as told by your health care  provider. °Elevate your feet for 15 minutes, 3-4 times a day. °Limit salt in your diet. °Safety °Wear your seat belt at all times when driving or riding in a car. °Talk with your health care provider if someone is verbally or physically abusive to you. °Lifestyle °Do not use hot tubs, steam rooms, or saunas. °Do not douche. Do not use tampons or scented sanitary pads. °Avoid cat litter boxes and soil used by cats. These carry germs that can cause birth defects in the baby and possibly loss of the fetus by miscarriage or stillbirth. °Do not use herbal remedies, alcohol, illegal drugs, or medicines that are not approved by your health care provider. Chemicals in these products can harm your baby. °Do not use any products that contain nicotine or tobacco, such as cigarettes, e-cigarettes, and chewing tobacco. If you need help quitting, ask your health care provider. °General instructions °During a routine prenatal visit, your health care provider will do a physical exam and other tests. He or she will also discuss your overall health. Keep all follow-up visits. This is important. °Ask your health care provider for a referral to a local prenatal education class. °Ask for help if you have counseling or nutritional needs during pregnancy. Your health care provider can offer advice or refer you to specialists for help with various needs. °Where to find more information °American Pregnancy Association: americanpregnancy.org °American College of Obstetricians and Gynecologists: acog.org/en/Womens%20Health/Pregnancy °Office on Women's Health: womenshealth.gov/pregnancy °Contact a health care provider if you have: °A headache that does not go away when you take medicine. °Vision changes or you see spots in front of your eyes. °Mild pelvic cramps, pelvic pressure, or nagging pain in the abdominal area. °Persistent nausea, vomiting, or diarrhea. °A bad-smelling vaginal discharge or foul-smelling urine. °Pain when you  urinate. °Sudden or extreme swelling of your face, hands, ankles, feet, or legs. °A fever. °Get help right away if you: °Have fluid leaking from your vagina. °Have spotting or bleeding from your vagina. °Have severe abdominal cramping or pain. °Have difficulty breathing. °Have chest pain. °Have fainting spells. °Have not felt your baby move for the time period told by your health care provider. °Have new or increased pain, swelling, or redness in an arm or leg. °Summary °The second trimester of pregnancy is from week 13 through week 27 (months 4 through 6). °Do not use herbal remedies, alcohol, illegal drugs, or medicines that are not approved by your health care provider. Chemicals in these products can harm your baby. °Exercise only as directed by your health care provider. Most people can continue their usual exercise routine during pregnancy. °Keep all follow-up visits. This is important. °This information is not intended to replace advice given to you by your health care provider. Make sure you discuss any questions you have with your health care provider. °Document Revised: 02/28/2020 Document Reviewed: 01/04/2020 °Elsevier Patient Education © 2022 [employer] Inc. ° °Contraception Choices °Contraception, also called birth control, refers to methods or devices that prevent pregnancy. °Hormonal methods °Contraceptive implant °A contraceptive implant is a thin, plastic tube that contains a hormone that prevents pregnancy. It is different from an intrauterine device (IUD). It   is inserted into the upper part of the arm by a health care provider. Implants can be effective for up to 3 years. °Progestin-only injections °Progestin-only injections are injections of progestin, a synthetic form of the hormone progesterone. They are given every 3 months by a health care provider. °Birth control pills °Birth control pills are pills that contain hormones that prevent pregnancy. They must be taken once a day, preferably at the  same time each day. A prescription is needed to use this method of contraception. °Birth control patch °The birth control patch contains hormones that prevent pregnancy. It is placed on the skin and must be changed once a week for three weeks and removed on the fourth week. A prescription is needed to use this method of contraception. °Vaginal ring °A vaginal ring contains hormones that prevent pregnancy. It is placed in the vagina for three weeks and removed on the fourth week. After that, the process is repeated with a new ring. A prescription is needed to use this method of contraception. °Emergency contraceptive °Emergency contraceptives prevent pregnancy after unprotected sex. They come in pill form and can be taken up to 5 days after sex. They work best the sooner they are taken after having sex. Most emergency contraceptives are available without a prescription. This method should not be used as your only form of birth control. °Barrier methods °Female condom °A female condom is a thin sheath that is worn over the penis during sex. Condoms keep sperm from going inside a woman's body. They can be used with a sperm-killing substance (spermicide) to increase their effectiveness. They should be thrown away after one use. °Female condom °A female condom is a soft, loose-fitting sheath that is put into the vagina before sex. The condom keeps sperm from going inside a woman's body. They should be thrown away after one use. °Diaphragm °A diaphragm is a soft, dome-shaped barrier. It is inserted into the vagina before sex, along with a spermicide. The diaphragm blocks sperm from entering the uterus, and the spermicide kills sperm. A diaphragm should be left in the vagina for 6-8 hours after sex and removed within 24 hours. °A diaphragm is prescribed and fitted by a health care provider. A diaphragm should be replaced every 1-2 years, after giving birth, after gaining more than 15 lb (6.8 kg), and after pelvic  surgery. °Cervical cap °A cervical cap is a round, soft latex or plastic cup that fits over the cervix. It is inserted into the vagina before sex, along with spermicide. It blocks sperm from entering the uterus. The cap should be left in place for 6-8 hours after sex and removed within 48 hours. A cervical cap must be prescribed and fitted by a health care provider. It should be replaced every 2 years. °Sponge °A sponge is a soft, circular piece of polyurethane foam with spermicide in it. The sponge helps block sperm from entering the uterus, and the spermicide kills sperm. To use it, you make it wet and then insert it into the vagina. It should be inserted before sex, left in for at least 6 hours after sex, and removed and thrown away within 30 hours. °Spermicides °Spermicides are chemicals that kill or block sperm from entering the cervix and uterus. They can come as a cream, jelly, suppository, foam, or tablet. A spermicide should be inserted into the vagina with an applicator at least 10-15 minutes before sex to allow time for it to work. The process must be repeated every time   you have sex. Spermicides do not require a prescription. °Intrauterine contraception °Intrauterine device (IUD) °An IUD is a T-shaped device that is put in a woman's uterus. There are two types: °Hormone IUD.This type contains progestin, a synthetic form of the hormone progesterone. This type can stay in place for 3-5 years. °Copper IUD.This type is wrapped in copper wire. It can stay in place for 10 years. °Permanent methods of contraception °Female tubal ligation °In this method, a woman's fallopian tubes are sealed, tied, or blocked during surgery to prevent eggs from traveling to the uterus. °Hysteroscopic sterilization °In this method, a small, flexible insert is placed into each fallopian tube. The inserts cause scar tissue to form in the fallopian tubes and block them, so sperm cannot reach an egg. The procedure takes about 3  months to be effective. Another form of birth control must be used during those 3 months. °Female sterilization °This is a procedure to tie off the tubes that carry sperm (vasectomy). After the procedure, the man can still ejaculate fluid (semen). Another form of birth control must be used for 3 months after the procedure. °Natural planning methods °Natural family planning °In this method, a couple does not have sex on days when the woman could become pregnant. °Calendar method °In this method, the woman keeps track of the length of each menstrual cycle, identifies the days when pregnancy can happen, and does not have sex on those days. °Ovulation method °In this method, a couple avoids sex during ovulation. °Symptothermal method °This method involves not having sex during ovulation. The woman typically checks for ovulation by watching changes in her temperature and in the consistency of cervical mucus. °Post-ovulation method °In this method, a couple waits to have sex until after ovulation. °Where to find more information °Centers for Disease Control and Prevention: www.cdc.gov °Summary °Contraception, also called birth control, refers to methods or devices that prevent pregnancy. °Hormonal methods of contraception include implants, injections, pills, patches, vaginal rings, and emergency contraceptives. °Barrier methods of contraception can include female condoms, female condoms, diaphragms, cervical caps, sponges, and spermicides. °There are two types of IUDs (intrauterine devices). An IUD can be put in a woman's uterus to prevent pregnancy for 3-5 years. °Permanent sterilization can be done through a procedure for males and females. Natural family planning methods involve nothaving sex on days when the woman could become pregnant. °This information is not intended to replace advice given to you by your health care provider. Make sure you discuss any questions you have with your health care provider. °Document  Revised: 02/26/2020 Document Reviewed: 02/26/2020 °Elsevier Patient Education © 2022 Elsevier Inc. ° °

## 2021-06-14 LAB — CBC/D/PLT+RPR+RH+ABO+RUBIGG...
Antibody Screen: NEGATIVE
Basophils Absolute: 0 10*3/uL (ref 0.0–0.2)
Basos: 0 %
EOS (ABSOLUTE): 0.1 10*3/uL (ref 0.0–0.4)
Eos: 1 %
HCV Ab: 0.2 s/co ratio (ref 0.0–0.9)
HIV Screen 4th Generation wRfx: NONREACTIVE
Hematocrit: 33.8 % — ABNORMAL LOW (ref 34.0–46.6)
Hemoglobin: 12.2 g/dL (ref 11.1–15.9)
Hepatitis B Surface Ag: NEGATIVE
Immature Grans (Abs): 0 10*3/uL (ref 0.0–0.1)
Immature Granulocytes: 0 %
Lymphocytes Absolute: 1.6 10*3/uL (ref 0.7–3.1)
Lymphs: 21 %
MCH: 35.7 pg — ABNORMAL HIGH (ref 26.6–33.0)
MCHC: 36.1 g/dL — ABNORMAL HIGH (ref 31.5–35.7)
MCV: 99 fL — ABNORMAL HIGH (ref 79–97)
Monocytes Absolute: 0.5 10*3/uL (ref 0.1–0.9)
Monocytes: 6 %
Neutrophils Absolute: 5.4 10*3/uL (ref 1.4–7.0)
Neutrophils: 72 %
Platelets: 288 10*3/uL (ref 150–450)
RBC: 3.42 x10E6/uL — ABNORMAL LOW (ref 3.77–5.28)
RDW: 12 % (ref 11.7–15.4)
RPR Ser Ql: NONREACTIVE
Rh Factor: POSITIVE
Rubella Antibodies, IGG: 1 index (ref >0.99)
WBC: 7.6 10*3/uL (ref 3.4–10.8)

## 2021-06-14 LAB — HCV INTERPRETATION

## 2021-06-16 ENCOUNTER — Telehealth: Payer: Self-pay

## 2021-06-16 DIAGNOSIS — A599 Trichomoniasis, unspecified: Secondary | ICD-10-CM | POA: Insufficient documentation

## 2021-06-16 LAB — CERVICOVAGINAL ANCILLARY ONLY
Bacterial Vaginitis (gardnerella): POSITIVE — AB
Candida Glabrata: NEGATIVE
Candida Vaginitis: NEGATIVE
Chlamydia: NEGATIVE
Comment: NEGATIVE
Comment: NEGATIVE
Comment: NEGATIVE
Comment: NEGATIVE
Comment: NEGATIVE
Comment: NORMAL
Neisseria Gonorrhea: NEGATIVE
Trichomonas: POSITIVE — AB

## 2021-06-16 MED ORDER — METRONIDAZOLE 500 MG PO TABS: 500.0000 mg | ORAL_TABLET | Freq: Two times a day (BID) | ORAL | 0 refills | Status: AC

## 2021-06-16 NOTE — Addendum Note (Signed)
Addended by: Merian Capron on: 06/16/2021 02:31 PM   Modules accepted: Orders

## 2021-06-16 NOTE — Telephone Encounter (Signed)
-----   Message from Venora Maples, MD sent at 06/16/2021  2:31 PM EDT ----- PL updated, please call and tell her to take treatment for both, rx sent. Please emphasize partner treatment as well.

## 2021-06-16 NOTE — Telephone Encounter (Signed)
Spoke to patient and informed on recent result of cervical ancillary swab. Patient was frustrated and wanted to know how she end up having Trich when her partner tested negative.  CMA advised patient to pick up her medication and will re-test later in her pregnancy.  Patient understood. Patient verified DOB.     Felecia Shelling, CMA

## 2021-06-19 LAB — URINE CULTURE, OB REFLEX

## 2021-06-19 LAB — CULTURE, OB URINE

## 2021-06-19 LAB — CYTOLOGY - PAP
Comment: NEGATIVE
Diagnosis: NEGATIVE
High risk HPV: NEGATIVE

## 2021-06-20 ENCOUNTER — Encounter: Payer: Self-pay | Admitting: Family Medicine

## 2021-06-20 DIAGNOSIS — R8271 Bacteriuria: Secondary | ICD-10-CM | POA: Insufficient documentation

## 2021-06-25 ENCOUNTER — Other Ambulatory Visit: Payer: Self-pay | Admitting: *Deleted

## 2021-06-25 ENCOUNTER — Other Ambulatory Visit: Payer: Self-pay | Admitting: Family Medicine

## 2021-06-25 ENCOUNTER — Ambulatory Visit: Admission: RE | Admit: 2021-06-25 | Payer: Medicaid Other | Source: Ambulatory Visit

## 2021-06-25 DIAGNOSIS — Z3491 Encounter for supervision of normal pregnancy, unspecified, first trimester: Secondary | ICD-10-CM

## 2021-06-25 DIAGNOSIS — Z8751 Personal history of pre-term labor: Secondary | ICD-10-CM

## 2021-06-25 DIAGNOSIS — Z3492 Encounter for supervision of normal pregnancy, unspecified, second trimester: Secondary | ICD-10-CM

## 2021-06-25 DIAGNOSIS — O0990 Supervision of high risk pregnancy, unspecified, unspecified trimester: Secondary | ICD-10-CM

## 2021-06-26 ENCOUNTER — Encounter: Payer: Self-pay | Admitting: General Practice

## 2021-06-26 ENCOUNTER — Telehealth: Payer: Self-pay | Admitting: General Practice

## 2021-06-26 NOTE — Telephone Encounter (Signed)
Patient called into front office returning phone call. Reviewed panorama results with patient and provided [facility]'s phone number to schedule genetic information session for more information. Patient verbalized understanding.

## 2021-06-26 NOTE — Telephone Encounter (Signed)
Attempted to call patient regarding [employer] results, no answer- left message to call us back.

## 2021-06-27 ENCOUNTER — Telehealth: Payer: Self-pay | Admitting: Medical

## 2021-06-27 ENCOUNTER — Encounter: Payer: Self-pay | Admitting: Family Medicine

## 2021-06-27 DIAGNOSIS — O285 Abnormal chromosomal and genetic finding on antenatal screening of mother: Secondary | ICD-10-CM | POA: Insufficient documentation

## 2021-06-27 NOTE — Telephone Encounter (Signed)
Called patient to discuss details of accommodations request. Patient requested that I return call at a later time. I advised that she could call me directly at the number provided to discuss when she had time.   Vonzella Nipple, PA-C 06/27/2021 5:01 PM

## 2021-06-30 ENCOUNTER — Ambulatory Visit: Payer: Medicaid Other

## 2021-07-01 ENCOUNTER — Ambulatory Visit: Payer: Medicaid Other

## 2021-07-04 ENCOUNTER — Encounter: Payer: Self-pay | Admitting: Radiology

## 2021-07-07 ENCOUNTER — Encounter: Payer: Medicaid Other | Admitting: Obstetrics and Gynecology

## 2021-07-10 ENCOUNTER — Encounter: Payer: Self-pay | Admitting: Radiology

## 2021-07-15 ENCOUNTER — Ambulatory Visit: Payer: Medicaid Other

## 2021-07-16 ENCOUNTER — Ambulatory Visit: Payer: Medicaid Other | Attending: Obstetrics | Admitting: Obstetrics

## 2021-07-16 ENCOUNTER — Encounter: Payer: Self-pay | Admitting: *Deleted

## 2021-07-16 ENCOUNTER — Ambulatory Visit: Payer: Medicaid Other | Admitting: *Deleted

## 2021-07-16 ENCOUNTER — Other Ambulatory Visit: Payer: Self-pay

## 2021-07-16 ENCOUNTER — Ambulatory Visit: Payer: Medicaid Other | Attending: Family Medicine

## 2021-07-16 VITALS — BP 113/52 | HR 93

## 2021-07-16 DIAGNOSIS — Z8751 Personal history of pre-term labor: Secondary | ICD-10-CM | POA: Diagnosis present

## 2021-07-16 DIAGNOSIS — O0990 Supervision of high risk pregnancy, unspecified, unspecified trimester: Secondary | ICD-10-CM | POA: Insufficient documentation

## 2021-07-16 DIAGNOSIS — O0992 Supervision of high risk pregnancy, unspecified, second trimester: Secondary | ICD-10-CM

## 2021-07-16 DIAGNOSIS — Z3689 Encounter for other specified antenatal screening: Secondary | ICD-10-CM | POA: Diagnosis not present

## 2021-07-16 DIAGNOSIS — O09899 Supervision of other high risk pregnancies, unspecified trimester: Secondary | ICD-10-CM | POA: Insufficient documentation

## 2021-07-16 DIAGNOSIS — Z3A16 16 weeks gestation of pregnancy: Secondary | ICD-10-CM | POA: Diagnosis not present

## 2021-07-16 DIAGNOSIS — O280 Abnormal hematological finding on antenatal screening of mother: Secondary | ICD-10-CM

## 2021-07-16 DIAGNOSIS — O09212 Supervision of pregnancy with history of pre-term labor, second trimester: Secondary | ICD-10-CM | POA: Diagnosis not present

## 2021-07-16 DIAGNOSIS — Z3492 Encounter for supervision of normal pregnancy, unspecified, second trimester: Secondary | ICD-10-CM | POA: Diagnosis present

## 2021-07-18 NOTE — Progress Notes (Signed)
MFM Note  Tiffany Deleon was seen for an early ultrasound as her cell free DNA test indicated atypical findings on sex chromosomes.  That test also indicated a low risk for trisomy 21, 18, and 13.  The fetal sex could not be determined.  Tiffany Deleon does not recommend a repeat test.  Her pregnancy history also includes a preterm delivery of twins at 69 weeks.  She denies any other significant medical or obstetrical history.  She was informed that the fetal growth and amniotic fluid level appears appropriate for her gestational age.(EFW 5 oz, 45%)  There were no obvious fetal anomalies noted on today's exam.  However, today's exam was limited by her early gestational age.  A female fetus is suspected based on today's exam.  However, the views of the fetal gender were limited due to the fetal position.  The patient declined to have a transvaginal cervical length performed today.  A transabdominal cervical length was 3.44 cm long without any signs of funneling.  The patient reports that her preterm birth was most likely due to the twin gestation.  Due to her prior preterm birth, weekly 17-P (Makena) injections may be considered starting at 16 weeks and continued until 36 weeks.  The patient will most likely decline this treatment.  Due to the atypical finding on her cell free DNA test, she was offered and declined an amniocentesis today for definitive diagnosis of fetal sex chromosome abnormalities and fetal chromosomal abnormalities.  A detailed fetal anatomy scan has been scheduled for her at around 19 weeks.  We will reassess the fetal gender at that exam.  A total of 30 minutes was spent counseling and coordinating the care for this patient.  Greater than 50% of the time was spent in direct face-to-face contact.

## 2021-07-22 ENCOUNTER — Encounter: Payer: Self-pay | Admitting: General Practice

## 2021-07-28 ENCOUNTER — Encounter: Payer: Medicaid Other | Admitting: Obstetrics & Gynecology

## 2021-08-06 ENCOUNTER — Ambulatory Visit: Payer: Medicaid Other

## 2021-08-13 ENCOUNTER — Ambulatory Visit (INDEPENDENT_AMBULATORY_CARE_PROVIDER_SITE_OTHER): Payer: Medicaid Other | Admitting: Family Medicine

## 2021-08-13 ENCOUNTER — Other Ambulatory Visit: Payer: Self-pay

## 2021-08-13 ENCOUNTER — Encounter: Payer: Self-pay | Admitting: Family Medicine

## 2021-08-13 VITALS — BP 112/76 | HR 76 | Wt 157.0 lb

## 2021-08-13 DIAGNOSIS — O0990 Supervision of high risk pregnancy, unspecified, unspecified trimester: Secondary | ICD-10-CM

## 2021-08-13 DIAGNOSIS — O219 Vomiting of pregnancy, unspecified: Secondary | ICD-10-CM

## 2021-08-13 DIAGNOSIS — A599 Trichomoniasis, unspecified: Secondary | ICD-10-CM

## 2021-08-13 DIAGNOSIS — O285 Abnormal chromosomal and genetic finding on antenatal screening of mother: Secondary | ICD-10-CM

## 2021-08-13 DIAGNOSIS — Z8751 Personal history of pre-term labor: Secondary | ICD-10-CM

## 2021-08-13 DIAGNOSIS — R8271 Bacteriuria: Secondary | ICD-10-CM

## 2021-08-13 MED ORDER — PROCHLORPERAZINE MALEATE 10 MG PO TABS: 10.0000 mg | ORAL_TABLET | Freq: Four times a day (QID) | ORAL | 0 refills | Status: DC | PRN

## 2021-08-13 MED ORDER — METRONIDAZOLE NICU ORAL SYRINGE 50 MG/ML: 500.0000 mg | Freq: Two times a day (BID) | ORAL | 0 refills | Status: DC

## 2021-08-13 MED ORDER — SCOPOLAMINE 1 MG/3DAYS TD PT72: 1.0000 | MEDICATED_PATCH | TRANSDERMAL | 12 refills | Status: DC

## 2021-08-13 NOTE — Progress Notes (Signed)
Flagyl   Subjective:  Sarabeth Benton is a 25 y.o. [phone] at [redacted]w[redacted]d being seen today for ongoing prenatal care.  She is currently monitored for the following issues for this high-risk pregnancy and has Short interval between pregnancies affecting pregnancy, antepartum; History of preterm delivery; Supervision of high risk pregnancy, antepartum; Trichomonas infection; GBS bacteriuria; and Abnormal genetic test during pregnancy on their problem list.  Patient reports no complaints.  Contractions: Not present. Vag. Bleeding: None.  Movement: Present. Denies leaking of fluid.   The following portions of the patient's history were reviewed and updated as appropriate: allergies, current medications, past family history, past medical history, past social history, past surgical history and problem list. Problem list updated.  Objective:   Vitals:   08/13/21 0939  BP: 112/76  Pulse: 76  Weight: 157 lb (71.2 kg)    Fetal Status: Fetal Heart Rate (bpm): 161   Movement: Present     General:  Alert, oriented and cooperative. Patient is in no acute distress.  Skin: Skin is warm and dry. No rash noted.   Cardiovascular: Normal heart rate noted  Respiratory: Normal respiratory effort, no problems with respiration noted  Abdomen: Soft, gravid, appropriate for gestational age. Pain/Pressure: Present     Pelvic: Vag. Bleeding: None     Cervical exam deferred        Extremities: Normal range of motion.  Edema: None  Mental Status: Normal mood and affect. Normal behavior. Normal judgment and thought content.   Urinalysis:      Assessment and Plan:  Pregnancy: [id number] at [redacted]w[redacted]d  1. Supervision of high risk pregnancy, antepartum BP and FHR normal AFP today Has anatomy scan scheduled for next week Ongoing n/v not relieved by phenergan or zofran, trial compazine and scop patch - AFP, Serum, Open Spina Bifida - AMBULATORY REFERRAL TO [facility]  2. Trichomonas infection Did not yet take  treatment Partner has tested negative but they have had unprotected sex since then Wants liquid formulation of flagyl Stressed that partner should be re-treated as well  3. History of preterm delivery At 31 weeks, twin gestation Discussed Makena, declines  4. GBS bacteriuria On new OB labs, 3k colonies  5. Abnormal genetic test during pregnancy Abnormal sex chromosomes, referred to [facility] for counseling Has [place] scheduled for next week  Preterm labor symptoms and general obstetric precautions including but not limited to vaginal bleeding, contractions, leaking of fluid and fetal movement were reviewed in detail with the patient. Please refer to After Visit Summary for other counseling recommendations.  Return in 4 weeks (on 09/10/2021) for [facility], ob visit, needs MD.   Venora Maples, MD

## 2021-08-13 NOTE — Patient Instructions (Signed)

## 2021-08-14 ENCOUNTER — Telehealth: Payer: Self-pay

## 2021-08-14 NOTE — Telephone Encounter (Signed)
Call received from [facility] at [facility] this morning saying that Rx Flagyl in liquid form does not exist or unable to order. Pt needs alternate as metrogel or tablet.   Call placed to pt. Pt did not answer. Pt left VM to return call to office to discuss Rx liquid Flagyl.   Laney Pastor

## 2021-08-15 LAB — AFP, SERUM, OPEN SPINA BIFIDA
AFP MoM: 0.78
AFP Value: 50.2 ng/mL
Gest. Age on Collection Date: 20.1 weeks
Maternal Age At EDD: 26.2 yr
OSBR Risk 1 IN: 10000
Test Results:: NEGATIVE
Weight: 157 [lb_av]

## 2021-08-15 MED ORDER — METRONIDAZOLE 0.75 % VA GEL: 1.0000 | Freq: Every day | VAGINAL | 0 refills | Status: AC

## 2021-08-15 NOTE — Telephone Encounter (Signed)
Spoke with pt. Pt given update on Rx liquid Flagyl. Pt agreeable to use Rx Metrogel. Pt verbalized understanding. New Rx Metrogel sent to pharmacy on file. Pt agreeable to plan of care.  Judeth Cornfield, RN

## 2021-08-20 ENCOUNTER — Other Ambulatory Visit: Payer: Self-pay | Admitting: *Deleted

## 2021-08-20 ENCOUNTER — Encounter: Payer: Self-pay | Admitting: *Deleted

## 2021-08-20 ENCOUNTER — Ambulatory Visit (HOSPITAL_BASED_OUTPATIENT_CLINIC_OR_DEPARTMENT_OTHER): Payer: Medicaid Other

## 2021-08-20 ENCOUNTER — Other Ambulatory Visit: Payer: Self-pay

## 2021-08-20 ENCOUNTER — Ambulatory Visit: Payer: Medicaid Other | Attending: Maternal & Fetal Medicine | Admitting: *Deleted

## 2021-08-20 VITALS — BP 115/66 | HR 103

## 2021-08-20 DIAGNOSIS — O09899 Supervision of other high risk pregnancies, unspecified trimester: Secondary | ICD-10-CM

## 2021-08-20 DIAGNOSIS — Z3689 Encounter for other specified antenatal screening: Secondary | ICD-10-CM | POA: Diagnosis not present

## 2021-08-20 DIAGNOSIS — O09292 Supervision of pregnancy with other poor reproductive or obstetric history, second trimester: Secondary | ICD-10-CM

## 2021-08-20 DIAGNOSIS — Z363 Encounter for antenatal screening for malformations: Secondary | ICD-10-CM | POA: Insufficient documentation

## 2021-08-20 DIAGNOSIS — O09892 Supervision of other high risk pregnancies, second trimester: Secondary | ICD-10-CM

## 2021-08-20 DIAGNOSIS — Z3A21 21 weeks gestation of pregnancy: Secondary | ICD-10-CM

## 2021-08-20 DIAGNOSIS — Z3687 Encounter for antenatal screening for uncertain dates: Secondary | ICD-10-CM | POA: Diagnosis not present

## 2021-08-20 DIAGNOSIS — Z3491 Encounter for supervision of normal pregnancy, unspecified, first trimester: Secondary | ICD-10-CM

## 2021-09-11 ENCOUNTER — Encounter: Payer: Medicaid Other | Admitting: Obstetrics and Gynecology

## 2021-09-24 ENCOUNTER — Other Ambulatory Visit: Payer: Self-pay

## 2021-09-24 ENCOUNTER — Ambulatory Visit: Payer: Medicaid Other | Attending: Obstetrics

## 2021-09-24 ENCOUNTER — Ambulatory Visit: Payer: Medicaid Other | Admitting: *Deleted

## 2021-09-24 ENCOUNTER — Other Ambulatory Visit: Payer: Self-pay | Admitting: *Deleted

## 2021-09-24 VITALS — BP 113/62 | HR 105

## 2021-09-24 DIAGNOSIS — Z3A26 26 weeks gestation of pregnancy: Secondary | ICD-10-CM

## 2021-09-24 DIAGNOSIS — O09893 Supervision of other high risk pregnancies, third trimester: Secondary | ICD-10-CM

## 2021-09-24 DIAGNOSIS — O09899 Supervision of other high risk pregnancies, unspecified trimester: Secondary | ICD-10-CM

## 2021-09-24 DIAGNOSIS — O09892 Supervision of other high risk pregnancies, second trimester: Secondary | ICD-10-CM | POA: Diagnosis present

## 2021-09-24 DIAGNOSIS — O3515X9 Maternal care for (suspected) chromosomal abnormality in fetus, sex chromosome abnormality, other fetus: Secondary | ICD-10-CM

## 2021-10-05 NOTE — L&D Delivery Note (Signed)
OB/GYN Faculty Practice Delivery Note ? ?Tiffany Deleon is a 26 y.o. [id number] s/p SVD at [redacted]w[redacted]d. She was admitted for IOL for DFM at term however ended up spontaneously laboring.  ? ?ROM: 0h 74m with clear fluid ?GBS Status: positive. Received one full dose of PCN and was in the process of getting her second dose- did not complete.  ?Maximum Maternal Temperature: 98.7 ? ?Labor Progress: ?Presented for decreased fetal movement. Was admitted for IOL but first received her first dose of PCN. At that time she started contracting painfully spontaneously and was found to be 6 cm with a bulging bag therefore her pitocin was not started and she was expectantly managed. Ultimately she SROMed while getting her second dose of PCN and progressed to complete and +4 soon after. ? ?Delivery Date/Time: 2328 on 3/27 ?Delivery: Called to room and patient was complete and pushing. Head delivered OA. No nuchal cord present. Shoulder and body delivered in usual fashion. Infant with spontaneous cry, placed on mother's abdomen, dried and stimulated. Cord clamped x 2 after 1-minute delay, and cut by father of baby under my direct supervision. Cord blood drawn. Placenta delivered spontaneously with gentle cord traction. Fundus firm with massage and Pitocin. Due to her multiparous status she was given a dose of TXA prophylactically. Labia, perineum, vagina, and cervix inspected and found to have a Third degree (3a) laceration that very minimally disrupted the capsule of her anal sphincter. Dr. Vergie Living called into room and assessed tear. Rectal exam without any disruption of rectal tissue.  ? ?The anal sphincter capsule was repaired with 3-0 vicryl in running fashion. The second degree portion of her laceration was repaired in usual running fashion with 3-0 vicryl. Patient tolerated procedure well. ? ?Placenta: intact, 3V cord, to L&D ?Complications: None ?Lacerations: 3a laceration ?EBL: 400cc ?Analgesia: Lidocaine for repair ? ? ?Infant: female   APGARs 9,9  weight pending ? ?Warner Mccreedy, MD, MPH ?OB Fellow, Faculty Practice ?[facility] [employer], [facility] ?12/30/2021, 12:31 AM ? ? ? ? ? ?

## 2021-10-09 ENCOUNTER — Other Ambulatory Visit: Payer: Self-pay

## 2021-10-09 ENCOUNTER — Ambulatory Visit (INDEPENDENT_AMBULATORY_CARE_PROVIDER_SITE_OTHER): Payer: Medicaid Other | Admitting: Family Medicine

## 2021-10-09 VITALS — BP 114/73 | HR 63 | Wt 176.6 lb

## 2021-10-09 DIAGNOSIS — O09899 Supervision of other high risk pregnancies, unspecified trimester: Secondary | ICD-10-CM

## 2021-10-09 DIAGNOSIS — O0990 Supervision of high risk pregnancy, unspecified, unspecified trimester: Secondary | ICD-10-CM

## 2021-10-09 DIAGNOSIS — A599 Trichomoniasis, unspecified: Secondary | ICD-10-CM

## 2021-10-09 DIAGNOSIS — Z8751 Personal history of pre-term labor: Secondary | ICD-10-CM

## 2021-10-09 MED ORDER — METRONIDAZOLE 0.75 % VA GEL: 1.0000 | Freq: Two times a day (BID) | VAGINAL | 0 refills | Status: DC

## 2021-10-09 NOTE — Progress Notes (Signed)
° °  PRENATAL VISIT NOTE  Subjective:  Tiffany Deleon is a 26 y.o. G3P1103 at [redacted]w[redacted]d being seen today for ongoing prenatal care.  She is currently monitored for the following issues for this low-risk pregnancy and has Short interval between pregnancies affecting pregnancy, antepartum; History of preterm delivery; Supervision of high risk pregnancy, antepartum; Trichomonas infection; GBS bacteriuria; and Abnormal genetic test during pregnancy on their problem list.  Patient reports no complaints.  Contractions: Not present. Vag. Bleeding: None.  Movement: Present. Denies leaking of fluid.   The following portions of the patient's history were reviewed and updated as appropriate: allergies, current medications, past family history, past medical history, past social history, past surgical history and problem list.   Objective:   Vitals:   10/09/21 1640  BP: 114/73  Pulse: 63  Weight: 176 lb 9.6 oz (80.1 kg)    Fetal Status: Fetal Heart Rate (bpm): 147 Fundal Height: 30 cm Movement: Present     General:  Alert, oriented and cooperative. Patient is in no acute distress.  Skin: Skin is warm and dry. No rash noted.   Cardiovascular: Normal heart rate noted  Respiratory: Normal respiratory effort, no problems with respiration noted  Abdomen: Soft, gravid, appropriate for gestational age.  Pain/Pressure: Absent     Pelvic: Cervical exam deferred        Extremities: Normal range of motion.  Edema: None  Mental Status: Normal mood and affect. Normal behavior. Normal judgment and thought content.   Assessment and Plan:  Pregnancy: G3P1103 at [redacted]w[redacted]d 1. Supervision of high risk pregnancy, antepartum Needs 28 wk labs - CBC; Future - CBC; Future - RPR; Future - HIV Antibody (routine testing w rflx); Future  2. Trichomonas infection Declines po--will attempt rx with metrogel - metroNIDAZOLE (METROGEL VAGINAL) 0.75 % vaginal gel; Place 1 Applicatorful vaginally 2 (two) times daily.  Dispense: 70 g;  Refill: 0  3. History of preterm delivery Due to twins, no s/x's of PTL now  4. Short interval between pregnancies affecting pregnancy, antepartum   Preterm labor symptoms and general obstetric precautions including but not limited to vaginal bleeding, contractions, leaking of fluid and fetal movement were reviewed in detail with the patient. Please refer to After Visit Summary for other counseling recommendations.   Return in about 2 weeks (around 10/23/2021) for HRC, 28 wk labs.  Future Appointments  Date Time Provider Department Center  10/10/2021  8:40 AM [facility]  10/23/2021  1:15 PM Adam Phenix, MD [facility]  10/29/2021 11:15 AM [facility] NURSE [facility]  10/29/2021 11:30 AM [facility] US3 [facility]    Reva Bores, MD

## 2021-10-10 ENCOUNTER — Other Ambulatory Visit: Payer: Medicaid Other

## 2021-10-10 DIAGNOSIS — D509 Iron deficiency anemia, unspecified: Secondary | ICD-10-CM

## 2021-10-10 DIAGNOSIS — O0990 Supervision of high risk pregnancy, unspecified, unspecified trimester: Secondary | ICD-10-CM

## 2021-10-11 LAB — CBC
Hematocrit: 28.4 % — ABNORMAL LOW (ref 34.0–46.6)
Hemoglobin: 10 g/dL — ABNORMAL LOW (ref 11.1–15.9)
MCH: 34.8 pg — ABNORMAL HIGH (ref 26.6–33.0)
MCHC: 35.2 g/dL (ref 31.5–35.7)
MCV: 99 fL — ABNORMAL HIGH (ref 79–97)
Platelets: 246 10*3/uL (ref 150–450)
RBC: 2.87 x10E6/uL — ABNORMAL LOW (ref 3.77–5.28)
RDW: 11.7 % (ref 11.7–15.4)
WBC: 8.9 10*3/uL (ref 3.4–10.8)

## 2021-10-11 LAB — GLUCOSE TOLERANCE, 2 HOURS W/ 1HR
Glucose, 1 hour: 134 mg/dL (ref 70–179)
Glucose, 2 hour: 101 mg/dL (ref 70–152)
Glucose, Fasting: 78 mg/dL (ref 70–91)

## 2021-10-11 LAB — HIV ANTIBODY (ROUTINE TESTING W REFLEX): HIV Screen 4th Generation wRfx: NONREACTIVE

## 2021-10-11 LAB — RPR: RPR Ser Ql: NONREACTIVE

## 2021-10-12 MED ORDER — FERROUS SULFATE 325 (65 FE) MG PO TABS: 325.0000 mg | ORAL_TABLET | ORAL | 1 refills | Status: DC

## 2021-10-13 ENCOUNTER — Telehealth: Payer: Self-pay

## 2021-10-13 NOTE — Telephone Encounter (Addendum)
-----   Message from Reva Bores, MD sent at 10/12/2021 12:13 PM EST ----- Has anemia, rx for iron sent in--please call and inform pt.  Pt notified of results and that an iron tablet has been sent to her [facility] pharmacy on [address].  Pt also informed of normal glucose results.  Pt verbalized understanding with no further questions.    Tiffany Deleon  10/13/21

## 2021-10-23 ENCOUNTER — Encounter: Payer: Medicaid Other | Admitting: Obstetrics & Gynecology

## 2021-10-28 ENCOUNTER — Encounter: Payer: Medicaid Other | Admitting: Obstetrics and Gynecology

## 2021-10-29 ENCOUNTER — Ambulatory Visit: Payer: Medicaid Other | Admitting: *Deleted

## 2021-10-29 ENCOUNTER — Ambulatory Visit: Payer: Medicaid Other | Attending: Maternal & Fetal Medicine

## 2021-10-29 ENCOUNTER — Other Ambulatory Visit: Payer: Self-pay

## 2021-10-29 ENCOUNTER — Other Ambulatory Visit: Payer: Self-pay | Admitting: *Deleted

## 2021-10-29 VITALS — BP 124/63 | HR 94

## 2021-10-29 DIAGNOSIS — O280 Abnormal hematological finding on antenatal screening of mother: Secondary | ICD-10-CM | POA: Insufficient documentation

## 2021-10-29 DIAGNOSIS — O09892 Supervision of other high risk pregnancies, second trimester: Secondary | ICD-10-CM | POA: Diagnosis present

## 2021-10-29 DIAGNOSIS — Z362 Encounter for other antenatal screening follow-up: Secondary | ICD-10-CM

## 2021-10-29 DIAGNOSIS — O3515X0 Maternal care for (suspected) chromosomal abnormality in fetus, sex chromosome abnormality, not applicable or unspecified: Secondary | ICD-10-CM | POA: Diagnosis not present

## 2021-10-29 DIAGNOSIS — O09893 Supervision of other high risk pregnancies, third trimester: Secondary | ICD-10-CM

## 2021-10-29 DIAGNOSIS — Z3A31 31 weeks gestation of pregnancy: Secondary | ICD-10-CM

## 2021-10-29 DIAGNOSIS — O3515X9 Maternal care for (suspected) chromosomal abnormality in fetus, sex chromosome abnormality, other fetus: Secondary | ICD-10-CM | POA: Insufficient documentation

## 2021-10-29 DIAGNOSIS — Q998 Other specified chromosome abnormalities: Secondary | ICD-10-CM

## 2021-11-03 ENCOUNTER — Other Ambulatory Visit: Payer: Self-pay

## 2021-11-03 ENCOUNTER — Encounter: Payer: Self-pay | Admitting: Family Medicine

## 2021-11-03 ENCOUNTER — Telehealth (INDEPENDENT_AMBULATORY_CARE_PROVIDER_SITE_OTHER): Payer: Medicaid Other | Admitting: Family Medicine

## 2021-11-03 DIAGNOSIS — O0993 Supervision of high risk pregnancy, unspecified, third trimester: Secondary | ICD-10-CM

## 2021-11-03 DIAGNOSIS — Z3A31 31 weeks gestation of pregnancy: Secondary | ICD-10-CM

## 2021-11-03 DIAGNOSIS — R8271 Bacteriuria: Secondary | ICD-10-CM

## 2021-11-03 DIAGNOSIS — A599 Trichomoniasis, unspecified: Secondary | ICD-10-CM

## 2021-11-03 DIAGNOSIS — O09213 Supervision of pregnancy with history of pre-term labor, third trimester: Secondary | ICD-10-CM

## 2021-11-03 DIAGNOSIS — O0990 Supervision of high risk pregnancy, unspecified, unspecified trimester: Secondary | ICD-10-CM

## 2021-11-03 DIAGNOSIS — O99820 Streptococcus B carrier state complicating pregnancy: Secondary | ICD-10-CM

## 2021-11-03 DIAGNOSIS — O09893 Supervision of other high risk pregnancies, third trimester: Secondary | ICD-10-CM

## 2021-11-03 DIAGNOSIS — Z8751 Personal history of pre-term labor: Secondary | ICD-10-CM

## 2021-11-03 DIAGNOSIS — O98313 Other infections with a predominantly sexual mode of transmission complicating pregnancy, third trimester: Secondary | ICD-10-CM

## 2021-11-03 DIAGNOSIS — O09899 Supervision of other high risk pregnancies, unspecified trimester: Secondary | ICD-10-CM

## 2021-11-03 DIAGNOSIS — O285 Abnormal chromosomal and genetic finding on antenatal screening of mother: Secondary | ICD-10-CM

## 2021-11-03 NOTE — Progress Notes (Signed)
I connected with Tiffany Deleon 11/03/21 at  1:15 PM EST by: MyChart video and verified that I am speaking with the correct person using two identifiers.  Patient is located in her vehicle and provider is located at [employer].     The purpose of this virtual visit is to provide medical care while limiting exposure to the novel coronavirus. I discussed the limitations, risks, security and privacy concerns of performing an evaluation and management service by MyChart video and the availability of in person appointments. I also discussed with the patient that there may be a patient responsible charge related to this service. By engaging in this virtual visit, you consent to the provision of healthcare.  Additionally, you authorize for your insurance to be billed for the services provided during this visit.  The patient expressed understanding and agreed to proceed.  The following staff members participated in the virtual visit: Evern Core, RN.    PRENATAL VISIT NOTE  Subjective:  Tiffany Deleon is a 26 y.o. [id number] at [redacted]w[redacted]d  for phone visit for ongoing prenatal care.  She is currently monitored for the following issues for this high-risk pregnancy and has Short interval between pregnancies affecting pregnancy, antepartum; History of preterm delivery; Supervision of high risk pregnancy, antepartum; Trichomonas infection; GBS bacteriuria; and Abnormal genetic test during pregnancy on their problem list.  Patient reports no complaints.  Contractions: Not present. Vag. Bleeding: None.  Movement: Present.  Denies leaking of fluid.   The following portions of the patient's history were reviewed and updated as appropriate: allergies, current medications, past family history, past medical history, past social history, past surgical history and problem list.   Objective:  Unable to self-obtain vital signs for this visit.   Fetal Status: Movement: Present  Assessment and Plan:  Pregnancy: G3P1103 at  [redacted]w[redacted]d  1. Supervision of high risk pregnancy, antepartum 2. [redacted] weeks gestation of pregnancy Progressing well. Normal fetal movement and no concerns today. Will follow up for next prenatal visit in-person in 2 weeks.  3. Short interval between pregnancies affecting pregnancy, antepartum  4. History of preterm delivery  5. GBS bacteriuria Plan for PCN in labor.   6. Abnormal genetic test during pregnancy Atypical finding on sex chromosomes. Otherwise reassuring genetic screening.  7. Trichomonas infection Has been prescribed vaginal gel and PO tablets multiple times for trichomonal infection diagnosed on 06/13/21. Patient states she is worried about risks to baby if she takes the medication. Does not want oral pills due to having a hard time swallowing pills. Vaginal gel already sent to pharmacy again last visit. Discussed risks of vaginal infection in pregnancy if untreated including preterm labor. Patient is agreeable to picking up medication to help treat infection. Will need TOC in 4 weeks. Patient states partner has been evaluated and treated. Discussed abstinence until she is appropriately treated as well and patient voiced understanding.   Preterm labor symptoms and general obstetric precautions including but not limited to vaginal bleeding, contractions, leaking of fluid and fetal movement were reviewed in detail with the patient.  Return in about 2 weeks (around 11/17/2021) for follow up HR OB visit.  Future Appointments  Date Time Provider [place]  12/03/2021 10:45 AM [facility] NURSE [facility]  12/03/2021 11:00 AM [facility] US1 [facility] [place]   Time spent on virtual visit: 7 minutes  Genia Del, MD

## 2021-11-18 ENCOUNTER — Telehealth (INDEPENDENT_AMBULATORY_CARE_PROVIDER_SITE_OTHER): Payer: Medicaid Other | Admitting: Family Medicine

## 2021-11-18 DIAGNOSIS — O99820 Streptococcus B carrier state complicating pregnancy: Secondary | ICD-10-CM

## 2021-11-18 DIAGNOSIS — O0993 Supervision of high risk pregnancy, unspecified, third trimester: Secondary | ICD-10-CM

## 2021-11-18 DIAGNOSIS — A599 Trichomoniasis, unspecified: Secondary | ICD-10-CM

## 2021-11-18 DIAGNOSIS — O98313 Other infections with a predominantly sexual mode of transmission complicating pregnancy, third trimester: Secondary | ICD-10-CM

## 2021-11-18 DIAGNOSIS — O285 Abnormal chromosomal and genetic finding on antenatal screening of mother: Secondary | ICD-10-CM

## 2021-11-18 DIAGNOSIS — O09213 Supervision of pregnancy with history of pre-term labor, third trimester: Secondary | ICD-10-CM | POA: Diagnosis not present

## 2021-11-18 DIAGNOSIS — Z3A34 34 weeks gestation of pregnancy: Secondary | ICD-10-CM

## 2021-11-18 DIAGNOSIS — O0990 Supervision of high risk pregnancy, unspecified, unspecified trimester: Secondary | ICD-10-CM

## 2021-11-18 DIAGNOSIS — R8271 Bacteriuria: Secondary | ICD-10-CM

## 2021-11-18 DIAGNOSIS — Z8751 Personal history of pre-term labor: Secondary | ICD-10-CM

## 2021-11-18 NOTE — Progress Notes (Signed)
° ° °  TELEHEALTH OBSTETRICS VISIT ENCOUNTER NOTE  Provider location: [facility] [place] [facility] [place] [facility]   Patient location: Home  I connected with Soul Everist on 11/18/21 at  3:55 PM EST by telephone at home and verified that I am speaking with the correct person using two identifiers. Of note, unable to do video encounter due to technical difficulties.    I discussed the limitations, risks, security and privacy concerns of performing an evaluation and management service by telephone and the availability of in person appointments. I also discussed with the patient that there may be a patient responsible charge related to this service. The patient expressed understanding and agreed to proceed.  Subjective:  Tiffany Deleon is a 26 y.o. G3P1103 at [redacted]w[redacted]d being followed for ongoing prenatal care.  She is currently monitored for the following issues for this high-risk pregnancy and has Short interval between pregnancies affecting pregnancy, antepartum; History of preterm delivery; Supervision of high risk pregnancy, antepartum; Trichomonas infection; GBS bacteriuria; and Abnormal genetic test during pregnancy on their problem list.  Patient reports no complaints. Reports fetal movement. Denies any contractions, bleeding or leaking of fluid.   The following portions of the patient's history were reviewed and updated as appropriate: allergies, current medications, past family history, past medical history, past social history, past surgical history and problem list.   Objective:  Last menstrual period 03/25/2021, unknown if currently breastfeeding. General:  Alert, oriented and cooperative.   Mental Status: Normal mood and affect perceived. Normal judgment and thought content.  Rest of physical exam deferred due to type of encounter  Assessment and Plan:  Pregnancy: G3P1103 at [redacted]w[redacted]d 1. Supervision of high risk pregnancy, antepartum Good fetal movement  2. Trichomonas  infection Diagnosed in August-September of last year Started taking metrogel yesterday Swabs at 38 week appt  3. History of preterm delivery At 31 weeks, twin gestation Previously declined makena  4. GBS bacteriuria Ppx in labor  5. Abnormal genetic test during pregnancy Abnormal sex chromosomes, otherwise normal NIPS  Preterm labor symptoms and general obstetric precautions including but not limited to vaginal bleeding, contractions, leaking of fluid and fetal movement were reviewed in detail with the patient.  I discussed the assessment and treatment plan with the patient. The patient was provided an opportunity to ask questions and all were answered. The patient agreed with the plan and demonstrated an understanding of the instructions. The patient was advised to call back or seek an in-person office evaluation/go to MAU at [facility] for any urgent or concerning symptoms. Please refer to After Visit Summary for other counseling recommendations.   I provided 10 minutes of non-face-to-face time during this encounter.  Return in 2 weeks (on 12/02/2021).  Future Appointments  Date Time Provider [place]  11/18/2021  3:55 PM Clarnce Flock, MD [facility]  12/03/2021 10:45 AM [facility] NURSE [facility]  12/03/2021 11:00 AM [facility] US1 [facility]    Clarnce Flock, MD [facility] [place] [place]

## 2021-11-18 NOTE — Patient Instructions (Signed)

## 2021-12-03 ENCOUNTER — Ambulatory Visit: Payer: Medicaid Other

## 2021-12-03 ENCOUNTER — Encounter: Payer: Medicaid Other | Admitting: Obstetrics & Gynecology

## 2021-12-09 ENCOUNTER — Ambulatory Visit: Payer: Medicaid Other

## 2021-12-09 ENCOUNTER — Ambulatory Visit (INDEPENDENT_AMBULATORY_CARE_PROVIDER_SITE_OTHER): Payer: Medicaid Other | Admitting: Advanced Practice Midwife

## 2021-12-09 ENCOUNTER — Other Ambulatory Visit: Payer: Self-pay

## 2021-12-09 VITALS — BP 134/88 | HR 102 | Wt 201.9 lb

## 2021-12-09 DIAGNOSIS — A599 Trichomoniasis, unspecified: Secondary | ICD-10-CM

## 2021-12-09 DIAGNOSIS — O0990 Supervision of high risk pregnancy, unspecified, unspecified trimester: Secondary | ICD-10-CM

## 2021-12-09 DIAGNOSIS — R8271 Bacteriuria: Secondary | ICD-10-CM | POA: Diagnosis not present

## 2021-12-09 DIAGNOSIS — Z3A37 37 weeks gestation of pregnancy: Secondary | ICD-10-CM | POA: Diagnosis not present

## 2021-12-09 NOTE — Progress Notes (Signed)
? ?  PRENATAL VISIT NOTE ? ?Subjective:  ?Tiffany Deleon is a 26 y.o. G3P1103 at [redacted]w[redacted]d being seen today for ongoing prenatal care.  She is currently monitored for the following issues for this high-risk pregnancy and has Short interval between pregnancies affecting pregnancy, antepartum; History of preterm delivery; Supervision of high risk pregnancy, antepartum; Trichomonas infection; GBS bacteriuria; and Abnormal genetic test during pregnancy on their problem list. ? ?Patient reports no complaints.  Contractions: Not present. Vag. Bleeding: None.  Movement: Present. Denies leaking of fluid.  ? ?The following portions of the patient's history were reviewed and updated as appropriate: allergies, current medications, past family history, past medical history, past social history, past surgical history and problem list. Problem list updated. ? ?Objective:  ? ?Vitals:  ? 12/09/21 1403  ?BP: 134/88  ?Pulse: (!) 102  ?Weight: 201 lb 14.4 oz (91.6 kg)  ? ? ?Fetal Status: Fetal Heart Rate (bpm): 146   Movement: Present    ? ?General:  Alert, oriented and cooperative. Patient is in no acute distress.  ?Skin: Skin is warm and dry. No rash noted.   ?Cardiovascular: Normal heart rate noted  ?Respiratory: Normal respiratory effort, no problems with respiration noted  ?Abdomen: Soft, gravid, appropriate for gestational age.  Pain/Pressure: Present     ?Pelvic: Cervical exam deferred        ?Extremities: Normal range of motion.  Edema: None  ?Mental Status: Normal mood and affect. Normal behavior. Normal judgment and thought content.  ? ?Assessment and Plan:  ?Pregnancy: [id number] at [redacted]w[redacted]d ? ?1. Supervision of high risk pregnancy, antepartum ?- No acute concerns ?- Reviewed indications for evaluation in MAU ? ? ?2. GBS bacteriuria ?- Tx in labor ? ?3. [redacted] weeks gestation of pregnancy ? ? ?4. Trichomonas infection ?- Treatment initiated 11/17/2021 ?- TOC next visit ? ?Term labor symptoms and general obstetric precautions including but  not limited to vaginal bleeding, contractions, leaking of fluid and fetal movement were reviewed in detail with the patient. ?Please refer to After Visit Summary for other counseling recommendations.  ?Return in about 1 week (around 12/16/2021) for APP or MD. ? ?Future Appointments  ?Date Time Provider [place]  ?12/09/2021  3:30 PM [facility] NURSE [facility]  ?12/09/2021  3:45 PM [facility] US6 [facility]  ?12/16/2021  2:35 PM Clarnce Flock, MD [facility]  ?12/23/2021  1:55 PM Renard Matter, MD [facility]  ?12/30/2021  2:15 PM Donnamae Jude, MD [facility]  ? ? ?Darlina Rumpf, CNM ? ?

## 2021-12-16 ENCOUNTER — Encounter: Payer: Medicaid Other | Admitting: Family Medicine

## 2021-12-17 ENCOUNTER — Ambulatory Visit (HOSPITAL_BASED_OUTPATIENT_CLINIC_OR_DEPARTMENT_OTHER): Payer: Medicaid Other

## 2021-12-17 ENCOUNTER — Other Ambulatory Visit: Payer: Self-pay

## 2021-12-17 ENCOUNTER — Ambulatory Visit: Payer: Medicaid Other | Attending: Obstetrics and Gynecology | Admitting: *Deleted

## 2021-12-17 VITALS — BP 123/64 | HR 101

## 2021-12-17 DIAGNOSIS — O09219 Supervision of pregnancy with history of pre-term labor, unspecified trimester: Secondary | ICD-10-CM

## 2021-12-17 DIAGNOSIS — O09213 Supervision of pregnancy with history of pre-term labor, third trimester: Secondary | ICD-10-CM | POA: Insufficient documentation

## 2021-12-17 DIAGNOSIS — Z3A38 38 weeks gestation of pregnancy: Secondary | ICD-10-CM | POA: Diagnosis not present

## 2021-12-17 DIAGNOSIS — Q998 Other specified chromosome abnormalities: Secondary | ICD-10-CM

## 2021-12-17 DIAGNOSIS — O285 Abnormal chromosomal and genetic finding on antenatal screening of mother: Secondary | ICD-10-CM

## 2021-12-17 DIAGNOSIS — O09893 Supervision of other high risk pregnancies, third trimester: Secondary | ICD-10-CM

## 2021-12-17 DIAGNOSIS — O09899 Supervision of other high risk pregnancies, unspecified trimester: Secondary | ICD-10-CM

## 2021-12-18 ENCOUNTER — Encounter: Payer: Medicaid Other | Admitting: Obstetrics & Gynecology

## 2021-12-18 ENCOUNTER — Telehealth: Payer: Self-pay | Admitting: Family Medicine

## 2021-12-18 NOTE — Telephone Encounter (Signed)
Called patient to inform of missed appointment, there was no answer to the phone call so a voicemail was left with the office call back number.  ?

## 2021-12-23 ENCOUNTER — Ambulatory Visit (INDEPENDENT_AMBULATORY_CARE_PROVIDER_SITE_OTHER): Payer: Medicaid Other | Admitting: Family Medicine

## 2021-12-23 DIAGNOSIS — Z91199 Patient's noncompliance with other medical treatment and regimen due to unspecified reason: Secondary | ICD-10-CM

## 2021-12-23 NOTE — Progress Notes (Signed)
Patient no showed appt. Message sent  to admin pool to reschedule. ? ?Warner Mccreedy, MD, MPH ?OB Fellow, Faculty Practice ? ?

## 2021-12-29 ENCOUNTER — Inpatient Hospital Stay (HOSPITAL_COMMUNITY)
Admission: AD | Admit: 2021-12-29 | Discharge: 2021-12-31 | DRG: 768 | Disposition: A | Discharge status: Home / Self Care | Payer: Medicaid Other | Attending: Obstetrics and Gynecology | Admitting: Obstetrics and Gynecology

## 2021-12-29 ENCOUNTER — Encounter (HOSPITAL_COMMUNITY): Payer: Self-pay | Admitting: Obstetrics and Gynecology

## 2021-12-29 ENCOUNTER — Other Ambulatory Visit: Payer: Self-pay

## 2021-12-29 DIAGNOSIS — O09899 Supervision of other high risk pregnancies, unspecified trimester: Secondary | ICD-10-CM

## 2021-12-29 DIAGNOSIS — O9832 Other infections with a predominantly sexual mode of transmission complicating childbirth: Secondary | ICD-10-CM | POA: Diagnosis not present

## 2021-12-29 DIAGNOSIS — A599 Trichomoniasis, unspecified: Secondary | ICD-10-CM | POA: Diagnosis present

## 2021-12-29 DIAGNOSIS — Z3A39 39 weeks gestation of pregnancy: Secondary | ICD-10-CM

## 2021-12-29 DIAGNOSIS — R8271 Bacteriuria: Secondary | ICD-10-CM | POA: Diagnosis present

## 2021-12-29 DIAGNOSIS — O368130 Decreased fetal movements, third trimester, not applicable or unspecified: Principal | ICD-10-CM | POA: Diagnosis present

## 2021-12-29 DIAGNOSIS — O99824 Streptococcus B carrier state complicating childbirth: Secondary | ICD-10-CM | POA: Diagnosis present

## 2021-12-29 DIAGNOSIS — O0990 Supervision of high risk pregnancy, unspecified, unspecified trimester: Secondary | ICD-10-CM

## 2021-12-29 LAB — COMPREHENSIVE METABOLIC PANEL
ALT: 12 U/L (ref 0–44)
AST: 17 U/L (ref 15–41)
Albumin: 2.6 g/dL — ABNORMAL LOW (ref 3.5–5.0)
Alkaline Phosphatase: 123 U/L (ref 38–126)
Anion gap: 10 (ref 5–15)
BUN: 9 mg/dL (ref 6–20)
CO2: 18 mmol/L — ABNORMAL LOW (ref 22–32)
Calcium: 8.9 mg/dL (ref 8.9–10.3)
Chloride: 107 mmol/L (ref 98–111)
Creatinine, Ser: 0.55 mg/dL (ref 0.44–1.00)
GFR, Estimated: >60 mL/min (ref >60)
Glucose, Bld: 101 mg/dL — ABNORMAL HIGH (ref 70–99)
Potassium: 3.6 mmol/L (ref 3.5–5.1)
Sodium: 135 mmol/L (ref 135–145)
Total Bilirubin: 0.4 mg/dL (ref 0.3–1.2)
Total Protein: 6.8 g/dL (ref 6.5–8.1)

## 2021-12-29 LAB — TYPE AND SCREEN
ABO/RH(D): A POS
Antibody Screen: NEGATIVE

## 2021-12-29 LAB — PROTEIN / CREATININE RATIO, URINE
Creatinine, Urine: 64.47 mg/dL
Protein Creatinine Ratio: 0.25 mg/mg{Cre} — ABNORMAL HIGH (ref 0.00–0.15)
Total Protein, Urine: 16 mg/dL

## 2021-12-29 LAB — CBC
HCT: 33.1 % — ABNORMAL LOW (ref 36.0–46.0)
Hemoglobin: 10.7 g/dL — ABNORMAL LOW (ref 12.0–15.0)
MCH: 30.8 pg (ref 26.0–34.0)
MCHC: 32.3 g/dL (ref 30.0–36.0)
MCV: 95.4 fL (ref 80.0–100.0)
Platelets: 292 10*3/uL (ref 150–400)
RBC: 3.47 MIL/uL — ABNORMAL LOW (ref 3.87–5.11)
RDW: 13.3 % (ref 11.5–15.5)
WBC: 14.1 10*3/uL — ABNORMAL HIGH (ref 4.0–10.5)
nRBC: 0 % (ref 0.0–0.2)

## 2021-12-29 MED ORDER — OXYTOCIN-SODIUM CHLORIDE 30-0.9 UT/500ML-% IV SOLN: 1.0000 m[IU]/min | INTRAVENOUS | Status: DC

## 2021-12-29 MED ORDER — TRANEXAMIC ACID-NACL 1000-0.7 MG/100ML-% IV SOLN: 1000.0000 mg | INTRAVENOUS | Status: AC

## 2021-12-29 MED ORDER — OXYCODONE-ACETAMINOPHEN 5-325 MG PO TABS: 2.0000 | ORAL_TABLET | ORAL | Status: DC | PRN

## 2021-12-29 MED ORDER — ACETAMINOPHEN 325 MG PO TABS: 650.0000 mg | ORAL_TABLET | ORAL | Status: DC | PRN

## 2021-12-29 MED ORDER — SODIUM CHLORIDE 0.9 % IV SOLN
5.0000 10*6.[IU] | Freq: Once | INTRAVENOUS | Status: AC
  Administered 2021-12-29: 5 10*6.[IU] via INTRAVENOUS
  Filled 2021-12-29: qty 5

## 2021-12-29 MED ORDER — LACTATED RINGERS IV SOLN: INTRAVENOUS | Status: DC

## 2021-12-29 MED ORDER — ONDANSETRON HCL 4 MG/2ML IJ SOLN: 4.0000 mg | Freq: Four times a day (QID) | INTRAMUSCULAR | Status: DC | PRN

## 2021-12-29 MED ORDER — LIDOCAINE HCL (PF) 1 % IJ SOLN
30.0000 mL | INTRAMUSCULAR | Status: AC | PRN
  Administered 2021-12-29: 30 mL via SUBCUTANEOUS
  Filled 2021-12-29: qty 30

## 2021-12-29 MED ORDER — TRANEXAMIC ACID-NACL 1000-0.7 MG/100ML-% IV SOLN
INTRAVENOUS | Status: AC
  Administered 2021-12-29: 1000 mg via INTRAVENOUS
  Filled 2021-12-29: qty 100

## 2021-12-29 MED ORDER — TERBUTALINE SULFATE 1 MG/ML IJ SOLN: 0.2500 mg | Freq: Once | INTRAMUSCULAR | Status: DC | PRN

## 2021-12-29 MED ORDER — OXYCODONE-ACETAMINOPHEN 5-325 MG PO TABS: 1.0000 | ORAL_TABLET | ORAL | Status: DC | PRN

## 2021-12-29 MED ORDER — LACTATED RINGERS IV SOLN: 500.0000 mL | INTRAVENOUS | Status: DC | PRN

## 2021-12-29 MED ORDER — OXYTOCIN-SODIUM CHLORIDE 30-0.9 UT/500ML-% IV SOLN
2.5000 [IU]/h | INTRAVENOUS | Status: DC
  Filled 2021-12-29: qty 500

## 2021-12-29 MED ORDER — OXYTOCIN BOLUS FROM INFUSION
333.0000 mL | Freq: Once | INTRAVENOUS | Status: AC
  Administered 2021-12-29: 333 mL via INTRAVENOUS

## 2021-12-29 MED ORDER — SOD CITRATE-CITRIC ACID 500-334 MG/5ML PO SOLN: 30.0000 mL | ORAL | Status: DC | PRN

## 2021-12-29 MED ORDER — LIDOCAINE HCL (PF) 1 % IJ SOLN
INTRAMUSCULAR | Status: AC
  Administered 2021-12-29: 30 mL via SUBCUTANEOUS
  Filled 2021-12-29: qty 30

## 2021-12-29 MED ORDER — PENICILLIN G POT IN DEXTROSE 60000 UNIT/ML IV SOLN
3.0000 10*6.[IU] | INTRAVENOUS | Status: DC
  Administered 2021-12-29: 3 10*6.[IU] via INTRAVENOUS
  Filled 2021-12-29: qty 50

## 2021-12-29 MED ORDER — METRONIDAZOLE 500 MG/100ML IV SOLN
500.0000 mg | INTRAVENOUS | Status: AC
  Administered 2021-12-29 – 2021-12-30 (×4): 500 mg via INTRAVENOUS
  Filled 2021-12-29 (×4): qty 100

## 2021-12-29 NOTE — H&P (Signed)
OBSTETRIC ADMISSION HISTORY AND PHYSICAL ? ?Tiffany Deleon is a 26 y.o. female [phone] with IUP at [redacted]w[redacted]d by LMP presenting for IOL due to new onset elevated blood pressures at term and decreased fetal movement. She reports +FMs, No LOF, no VB, no blurry vision, headaches or peripheral edema, and RUQ pain.  She plans on breast and formula feeding. She is unsure for birth control. ?She received her prenatal care at [facility]  ? ?Dating: By LMP --->  Estimated Date of Delivery: 12/30/21 ? ?Sono:   ? ?@[redacted]w[redacted]d , CWD, normal anatomy, cephalic presentation, 3416g, EFW ? ? ?Prenatal History/Complications:  ?--Atypical finding on sex chromosomes but low risk for triploidy ?--Vaginal trichomonas, patient reports she doesn't do well with pills but did use the metrogel about 3 weeks ago ?--GBS positive in urine  ? ?Past Medical History: ?Past Medical History:  ?Diagnosis Date  ? Medical history non-contributory   ? ? ?Past Surgical History: ?Past Surgical History:  ?Procedure Laterality Date  ? NO PAST SURGERIES    ? ? ?Obstetrical History: ?OB History   ? ? Gravida  ?3  ? Para  ?2  ? Term  ?1  ? Preterm  ?1  ? AB  ?0  ? Living  ?3  ?  ? ? SAB  ?0  ? IAB  ?0  ? Ectopic  ?0  ? Multiple  ?1  ? Live Births  ?3  ?   ?  ?  ? ? ?Social History ?Social History  ? ?Socioeconomic History  ? Marital status: Single  ?  Spouse name: Not on file  ? Number of children: Not on file  ? Years of education: Not on file  ? Highest education level: Not on file  ?Occupational History  ? Not on file  ?Tobacco Use  ? Smoking status: Never  ? Smokeless tobacco: Never  ?Vaping Use  ? Vaping Use: Never used  ?Substance and Sexual Activity  ? Alcohol use: No  ? Drug use: Not Currently  ? Sexual activity: Yes  ?  Birth control/protection: None  ?Other Topics Concern  ? Not on file  ?Social History Narrative  ? Not on file  ? ?Social Determinants of Health  ? ?Financial Resource Strain: Not on file  ?Food Insecurity: No Food Insecurity  ? Worried About 67% in the Last Year: Never true  ? Ran Out of Food in the Last Year: Never true  ?Transportation Needs: No Transportation Needs  ? Lack of Transportation (Medical): No  ? Lack of Transportation (Non-Medical): No  ?Physical Activity: Not on file  ?Stress: Not on file  ?Social Connections: Not on file  ? ? ?Family History: ?Family History  ?Problem Relation Age of Onset  ? Depression Neg Hx   ? Diabetes Neg Hx   ? Hypertension Neg Hx   ? ? ?Allergies: ?No Known Allergies ? ?Medications Prior to Admission  ?Medication Sig Dispense Refill Last Dose  ? Prenatal Vit-Fe Fumarate-FA (PRENATAL VITAMIN PO) Take by mouth.   12/29/2021  ? metroNIDAZOLE (METROGEL) 0.75 % vaginal gel Place vaginally at bedtime. (Patient not taking: Reported on 12/09/2021)     ? ? ? ?Review of Systems  ? ?All systems reviewed and negative except as stated in HPI ? ?Blood pressure 113/62, pulse (!) 107, temperature 98.7 ?F (37.1 ?C), temperature source Oral, resp. rate 18, last menstrual period 03/25/2021, unknown if currently breastfeeding. ?General appearance: alert, cooperative, and no distress ?Lungs: clear to auscultation bilaterally ?Heart:  regular rate and rhythm ?Abdomen: soft, non-tender; bowel sounds normal ?Pelvic: NEFG ?Extremities: Homans sign is negative, no sign of DVT ?Presentation: cephalic ?Fetal monitoringBaseline: 145 bpm, Variability: Good {> 6 bpm), Accelerations: Reactive, and Decelerations: Absent ?Uterine activity every 5-10 minutes  ? ?Dilation: 3 ?Effacement (%): 80 ?Cervical Position: Middle ?Station: -3 ?Presentation: Vertex ?Exam by:: Dr. Annia Friendly  ? ? ?Prenatal labs: ?ABO, Rh: --/--/PENDING (03/27 1727) ?Antibody: PENDING (03/27 1727) ?Rubella: 1.00 (09/09 1148) ?RPR: Non Reactive (01/06 0848)  ?HBsAg: Negative (09/09 1148)  ?HIV: Non Reactive (01/06 0848)  ?GBS:   positive in urine  ?2 hr Glucola passed ?Genetic screening LR for trisomies but abnormal sex chromosomes  ?Anatomy US normal  ? ?Prenatal Transfer Tool   ?Maternal Diabetes: No ?Genetic Screening: Normal ?Maternal Ultrasounds/Referrals: Normal ?Fetal Ultrasounds or other Referrals:  None ?Maternal Substance Abuse:  No ?Significant Maternal Medications:  None ?Significant Maternal Lab Results: Group B Strep positive ? ?Patient Active Problem List  ? Diagnosis Date Noted  ? Indication for care in labor and delivery, antepartum 12/29/2021  ? Abnormal genetic test during pregnancy 06/27/2021  ? GBS bacteriuria 06/20/2021  ? Trichomonas infection 06/16/2021  ? Supervision of high risk pregnancy, antepartum 05/27/2021  ? History of preterm delivery 09/06/2017  ? Short interval between pregnancies affecting pregnancy, antepartum 07/14/2017  ? ? ?Assessment/Plan:  ?Tiffany Deleon is a 26 y.o. G3P1103 at [redacted]w[redacted]d here for IOL due to elevated blood pressures at term and DFM.  ? ?#Labor: Favorable cervix on recheck with some change since checked in the MAU. Offered expectant management vs pit, patient would like to do pit.  ?#Pain: PRN ?#FWB: Cat I  ?#ID: GBS positive in urine, PCN  ?#MOF: Breast and formula  ?#MOC: Undecided, aware of options  ?#Circ: Yes, inpatient  ? ?#Elevated blood pressure: New onset. Mild range. Asymptomatic. Will obtain pre-e labs. Monitor symptoms closely.  ? ?#Vaginal Trich: Recently took metro-gel but does not want to take pills. Plan for IV flagyl 2g.  ? ?Allayne Stack, DO  ?12/29/2021, 7:05 PM ? ?  ?

## 2021-12-29 NOTE — MAU Note (Addendum)
.  Tiffany Deleon is a 26 y.o. at [redacted]w[redacted]d here in MAU reporting: ctx about every 3 min since 1200. denies any vag bleeding or leaking. Reports decreased fetal movement today. ?Onset of complaint: 1200 ?Pain score: 9/10 ?Vitals:  ? 12/29/21 1652  ?BP: (!) 150/79  ?Pulse: (!) 127  ?Resp: 18  ?Temp: 98.3 ?F (36.8 ?C)  ?   ?FHT:145 ?Lab orders placed from triage:  none ? ?

## 2021-12-29 NOTE — Progress Notes (Shared)
Patient reports feeling pelvic and rectal pressure. ? ?Contracting spontaneously frequently. Previously plan was to start pitocin however patient wanted to  ?

## 2021-12-29 NOTE — MAU Provider Note (Signed)
S: Ms. Tiffany Deleon is a 26 y.o. [phone] at [redacted]w[redacted]d  who presents to MAU today complaining contractions q 3-5 minutes since 0800. She denies vaginal bleeding. She denies LOF. She reports normal fetal movement.   ? ?O: BP (!) 146/85   Pulse (!) 140   Temp 98.3 ?F (36.8 ?C)   Resp 18   LMP 03/25/2021  ?GENERAL: Well-developed, well-nourished female in no acute distress.  ?HEAD: Normocephalic, atraumatic.  ?CHEST: Normal effort of breathing, regular heart rate ?ABDOMEN: Soft, nontender, gravid ? ?Cervical exam:  ?Dilation: 2.5 ?Effacement (%): 30 ?Cervical Position: Middle ?Station: -3 ?Presentation: Undeterminable ?Exam by:: K.Wilson,RN ? ? ?Fetal Monitoring: ?Baseline: 140 ?Variability: moderate ?Accelerations: 15x15 ?Decelerations: none ?Contractions: 4-7 ? ?Pt informed that the ultrasound is considered a limited OB ultrasound and is not intended to be a complete ultrasound exam.  Patient also informed that the ultrasound is not being completed with the intent of assessing for fetal or placental anomalies or any pelvic abnormalities.  Explained that the purpose of today?s ultrasound is to assess for  presentation.  Patient acknowledges the purpose of the exam and the limitations of the study.    ? ?Vertex confirmed ? ?A: ?SIUP at [redacted]w[redacted]d  ?Discussed with patient that with decreased fetal movement and new onset HTN, our recommendation is admission. Patient agreeable to plan of care ? ?P: ?-Admit to labor and delivery ?-Report called to labor team ? ?Rolm Bookbinder, CNM ?12/29/2021 5:18 PM ? ?

## 2021-12-30 ENCOUNTER — Encounter (HOSPITAL_COMMUNITY): Payer: Self-pay | Admitting: Obstetrics and Gynecology

## 2021-12-30 ENCOUNTER — Encounter: Payer: Medicaid Other | Admitting: Family Medicine

## 2021-12-30 LAB — RPR: RPR Ser Ql: NONREACTIVE

## 2021-12-30 LAB — CBC
HCT: 27.3 % — ABNORMAL LOW (ref 36.0–46.0)
Hemoglobin: 9.2 g/dL — ABNORMAL LOW (ref 12.0–15.0)
MCH: 31.7 pg (ref 26.0–34.0)
MCHC: 33.7 g/dL (ref 30.0–36.0)
MCV: 94.1 fL (ref 80.0–100.0)
Platelets: 241 10*3/uL (ref 150–400)
RBC: 2.9 MIL/uL — ABNORMAL LOW (ref 3.87–5.11)
RDW: 13.5 % (ref 11.5–15.5)
WBC: 13.6 10*3/uL — ABNORMAL HIGH (ref 4.0–10.5)
nRBC: 0 % (ref 0.0–0.2)

## 2021-12-30 MED ORDER — OXYCODONE HCL 5 MG PO TABS: 5.0000 mg | ORAL_TABLET | Freq: Four times a day (QID) | ORAL | Status: DC | PRN

## 2021-12-30 MED ORDER — ONDANSETRON HCL 4 MG/2ML IJ SOLN: 4.0000 mg | INTRAMUSCULAR | Status: DC | PRN

## 2021-12-30 MED ORDER — MEDROXYPROGESTERONE ACETATE 150 MG/ML IM SUSP: 150.0000 mg | INTRAMUSCULAR | Status: DC | PRN

## 2021-12-30 MED ORDER — BENZOCAINE-MENTHOL 20-0.5 % EX AERO
1.0000 | INHALATION_SPRAY | CUTANEOUS | Status: DC | PRN
Start: 2021-12-30 — End: 2021-12-31
  Administered 2021-12-30: 1 via TOPICAL
  Filled 2021-12-30: qty 56

## 2021-12-30 MED ORDER — LACTATED RINGERS IV SOLN: Freq: Once | INTRAVENOUS | Status: AC

## 2021-12-30 MED ORDER — ACETAMINOPHEN 325 MG PO TABS
650.0000 mg | ORAL_TABLET | ORAL | Status: DC | PRN
  Administered 2021-12-30: 650 mg via ORAL
  Filled 2021-12-30: qty 2

## 2021-12-30 MED ORDER — WITCH HAZEL-GLYCERIN EX PADS: 1.0000 "application " | MEDICATED_PAD | CUTANEOUS | Status: DC | PRN

## 2021-12-30 MED ORDER — ONDANSETRON HCL 4 MG PO TABS: 4.0000 mg | ORAL_TABLET | ORAL | Status: DC | PRN

## 2021-12-30 MED ORDER — SENNOSIDES-DOCUSATE SODIUM 8.6-50 MG PO TABS
2.0000 | ORAL_TABLET | Freq: Every day | ORAL | Status: DC
  Filled 2021-12-30: qty 2

## 2021-12-30 MED ORDER — IBUPROFEN 100 MG/5ML PO SUSP
600.0000 mg | Freq: Four times a day (QID) | ORAL | Status: DC
  Administered 2021-12-30 – 2021-12-31 (×4): 600 mg via ORAL
  Filled 2021-12-30 (×4): qty 30

## 2021-12-30 MED ORDER — COCONUT OIL OIL: 1.0000 "application " | TOPICAL_OIL | Status: DC | PRN

## 2021-12-30 MED ORDER — POLYETHYLENE GLYCOL 3350 17 G PO PACK
17.0000 g | PACK | Freq: Every day | ORAL | Status: DC
  Administered 2021-12-30: 17 g via ORAL
  Filled 2021-12-30: qty 1

## 2021-12-30 MED ORDER — IBUPROFEN 600 MG PO TABS
600.0000 mg | ORAL_TABLET | Freq: Four times a day (QID) | ORAL | Status: DC
  Administered 2021-12-30: 600 mg via ORAL
  Filled 2021-12-30 (×3): qty 1

## 2021-12-30 MED ORDER — LACTATED RINGERS IV BOLUS
1000.0000 mL | Freq: Once | INTRAVENOUS | Status: AC
  Administered 2021-12-30: 1000 mL via INTRAVENOUS

## 2021-12-30 MED ORDER — DIPHENHYDRAMINE HCL 25 MG PO CAPS: 25.0000 mg | ORAL_CAPSULE | Freq: Four times a day (QID) | ORAL | Status: DC | PRN

## 2021-12-30 MED ORDER — DIBUCAINE (PERIANAL) 1 % EX OINT: 1.0000 "application " | TOPICAL_OINTMENT | CUTANEOUS | Status: DC | PRN

## 2021-12-30 MED ORDER — SIMETHICONE 80 MG PO CHEW: 80.0000 mg | CHEWABLE_TABLET | ORAL | Status: DC | PRN

## 2021-12-30 MED ORDER — MEASLES, MUMPS & RUBELLA VAC IJ SOLR: 0.5000 mL | Freq: Once | INTRAMUSCULAR | Status: DC

## 2021-12-30 MED ORDER — TETANUS-DIPHTH-ACELL PERTUSSIS 5-2.5-18.5 LF-MCG/0.5 IM SUSY: 0.5000 mL | PREFILLED_SYRINGE | Freq: Once | INTRAMUSCULAR | Status: DC

## 2021-12-30 MED ORDER — PRENATAL MULTIVITAMIN CH
1.0000 | ORAL_TABLET | Freq: Every day | ORAL | Status: DC
  Filled 2021-12-30 (×2): qty 1

## 2021-12-30 NOTE — Discharge Summary (Signed)
? ?  Postpartum Discharge Summary ?   ?Patient Name: Tiffany Deleon ?DOB: [birth year] ?MRN: [id number] ? ?Date of admission: 12/29/2021 ?Delivery date:12/29/2021  ?Delivering provider: Renard Matter  ?Date of discharge: 12/31/2021 ? ?Admitting diagnosis: Indication for care in labor and delivery, antepartum [O75.9] ?Intrauterine pregnancy: [redacted]w[redacted]d    ?Secondary diagnosis:  Principal Problem: ?  Indication for care in labor and delivery, antepartum ?Active Problems: ?  Short interval between pregnancies affecting pregnancy, antepartum ?  Supervision of high risk pregnancy, antepartum ?  Trichomonas infection ?  GBS bacteriuria ?  Type 3a perineal laceration ? ?Additional problems: None    ?Discharge diagnosis: Term Pregnancy Delivered                                              ?Post partum procedures: None ?Augmentation: N/A ?Complications: None ? ?Hospital course: Induction of Labor With Vaginal Delivery   ?26y.o. yo [id number] admitted to the hospital 12/29/2021 for induction of labor.  Indication for induction:  DFM .  While patient waiting to finish her PCN treatment for GBS she went into spontaneous labor and did not need IOL methods. She SROMed and progressed to complete soon after. Patient had an uncomplicated labor course as follows: ?Membrane Rupture Time/Date: 11:12 PM ,12/29/2021   ?Delivery Method:Vaginal, Spontaneous  ?Episiotomy: None  ?Lacerations:  Perineal;3rd degree  ?Details of delivery can be found in separate delivery note.  Patient had a routine postpartum course. Patient is discharged home 12/31/21. ? ?Newborn Data: ?Birth date:12/29/2021  ?Birth time:11:28 PM  ?Gender:Female  ?Living status:Living  ?Apgars:9 ,9  ?Weight:3750 g  ? ?Magnesium Sulfate received: No ?BMZ received: No ?Rhophylac:N/A ?MMR:N/A ?T-DaP: declined ?Flu: declined ?Transfusion:No ? ?Physical exam  ?Vitals:  ? 12/30/21 1100 12/30/21 1600 12/30/21 1947 12/31/21 0612  ?BP: 127/77 132/73 108/74 112/69  ?Pulse: (!) 114 (!) 110 96  86  ?Resp: _0 ?Temp: 99.8 ?F (37.7 ?C) 98.3 ?F (36.8 ?C) 98.2 ?F (36.8 ?C) 97.6 ?F (36.4 ?C)  ?TempSrc: Oral Oral Oral Oral  ?SpO2:  99% 99% 100%  ? ?General: alert ?Lochia: appropriate ?Uterine Fundus: firm ?Incision: N/A ?DVT Evaluation: No evidence of DVT seen on physical exam. ?Labs: ?Lab Results  ?Component Value Date  ? WBC 13.6 (H) 12/30/2021  ? HGB 9.2 (L) 12/30/2021  ? HCT 27.3 (L) 12/30/2021  ? MCV 94.1 12/30/2021  ? PLT 241 12/30/2021  ? ? ?  Latest Ref Rng & Units 12/29/2021  ?  5:32 PM  ?CMP  ?Glucose 70 - 99 mg/dL 101    ?BUN 6 - 20 mg/dL 9    ?Creatinine 0.44 - 1.00 mg/dL 0.55    ?Sodium 135 - 145 mmol/L 135    ?Potassium 3.5 - 5.1 mmol/L 3.6    ?Chloride 98 - 111 mmol/L 107    ?CO2 22 - 32 mmol/L 18    ?Calcium 8.9 - 10.3 mg/dL 8.9    ?Total Protein 6.5 - 8.1 g/dL 6.8    ?Total Bilirubin 0.3 - 1.2 mg/dL 0.4    ?Alkaline Phos 38 - 126 U/L 123    ?AST 15 - 41 U/L 17    ?ALT 0 - 44 U/L 12    ? ?Edinburgh Score: ? ?  12/30/2021  ?  9:50 AM  ?EdFlavia Shipperostnatal Depression Scale Screening Tool  ?I have been  able to laugh and see the funny side of things. 0  ?I have looked forward with enjoyment to things. 0  ?I have blamed myself unnecessarily when things went wrong. 0  ?I have been anxious or worried for no good reason. 0  ?I have felt scared or panicky for no good reason. 0  ?Things have been getting on top of me. 0  ?I have been so unhappy that I have had difficulty sleeping. 0  ?I have felt sad or miserable. 0  ?I have been so unhappy that I have been crying. 0  ?The thought of harming myself has occurred to me. 0  ?Edinburgh Postnatal Depression Scale Total 0  ? ? ? ?After visit meds:  ?Allergies as of 12/31/2021   ?No Known Allergies ?  ? ?  ?Medication List  ?  ? ?STOP taking these medications   ? ?metroNIDAZOLE 0.75 % vaginal gel ?Commonly known as: METROGEL ?  ? ?  ? ?TAKE these medications   ? ?acetaminophen 325 MG tablet ?Commonly known as: Tylenol ?Take 2 tablets (650 mg total) by mouth  every 4 (four) hours as needed (for pain scale < 4). ?  ?ibuprofen 100 MG/5ML suspension ?Commonly known as: ADVIL ?Take 30 mLs (600 mg total) by mouth every 6 (six) hours. ?  ?polyethylene glycol 17 g packet ?Commonly known as: MIRALAX / GLYCOLAX ?Take 17 g by mouth daily. ?  ?PRENATAL VITAMIN PO ?Take by mouth. ?  ?senna-docusate 8.6-50 MG tablet ?Commonly known as: Senokot-S ?Take 2 tablets by mouth at bedtime as needed for up to 10 days for mild constipation. ?  ? ?  ? ? ? ?Discharge home in stable condition ?Infant Feeding: Bottle and Breast ?Infant Disposition:home with mother ?Discharge instruction: per After Visit Summary and Postpartum booklet. ?Activity: Advance as tolerated. Pelvic rest for 6 weeks.  ?Diet: routine diet ?Future Appointments: ?Future Appointments  ?Date Time Provider [place]  ?01/06/2022 10:20 AM [facility] NURSE [facility]  ?01/29/2022  3:15 PM Renard Matter, MD [facility]  ? ?Follow up Visit: ?Message sent to [facility] by Dr. Cy Blamer on 3/28 ? ?Please schedule this patient for a In person postpartum visit in 4 weeks with the following provider: Any provider. ?Additional Postpartum F/U: laceration check in 1 week   ?Low risk pregnancy complicated by:  limited prenatal care ?Delivery mode:  Vaginal, Spontaneous  ?Anticipated Birth Control:  Unsure- would like to discuss at post partum visit ? ?Renard Matter, MD, MPH ?OB Fellow, Faculty Practice ? ? ? ?

## 2021-12-30 NOTE — Progress Notes (Signed)
POSTPARTUM PROGRESS NOTE ? ?Subjective: Tiffany Deleon is a 26 y.o. [id number] PPD#1 s/p SVDLTCS at [redacted]w[redacted]d (late evening delivery).  She reports she doing well. No acute events overnight. She denies any problems with ambulating, voiding or po intake. Denies nausea or vomiting. She has  passed flatus. Pain is well controlled.  Lochia is scant. ? ?Objective: ?Blood pressure 122/75, pulse (!) 122, temperature 99.6 ?F (37.6 ?C), resp. rate 18, last menstrual period 03/25/2021, SpO2 99 %, unknown if currently breastfeeding. ? ?Physical Exam:  ?General: alert, cooperative and no distress. Warm to touch ?Chest: no respiratory distress ?Abdomen: soft, non-tender  ?Uterine Fundus: firm, appropriately tender ?Extremities: No calf swelling or tenderness  no edema ? ?Recent Labs  ?  12/29/21 ?1732  ?HGB 10.7*  ?HCT 33.1*  ? ? ?Assessment/Plan: ?Tiffany Deleon is a 26 y.o. [id number] PPD#1 s/p SVDLTCS at [redacted]w[redacted]d (late evening delivery). ? ?Routine Postpartum Care: Doing well, pain well-controlled.  ?-- Continue routine care, lactation support  ?-- Contraception: wants to wait till 6 week visit ?-- Feeding: breast and formula ? ?#3a laceration ?Pain is well controlled ? ?#Hx of trichomonas in pregnancy ?Did not get treated due to patient preference of not wanting to take PO pills. Did get IV flagyl on labor ? ?#Elevated temp post partum ?Patient with temp of 100.3 this AM. Will plan for recheck. If continues to be elevated plan  to treat as endometritis with [place]. ? ?#Tachycardic to 110s-120s ?Will recheck CBC and give fluid bolus.  ? ?Dispo: Plan for discharge PPD#2 given late evening delivery ? ?Tiffany Mccreedy, MD, MPH ?OB Fellow, Faculty Practice ?Center for [facility]  ? ?

## 2021-12-30 NOTE — Lactation Note (Signed)
This note was copied from a baby's chart. ?Lactation Consultation Note ? ?Patient Name: Tiffany Deleon ?Today's Date: 12/30/2021 ?Reason for consult: Initial assessment;Term;Other (Comment) (per [facility] Eula Fried- baby had been STS for 60 mins , attempted at the breast and to sleepy. LC encouraged mom to call with feeding cues for latch assessment.) ?Age:27 hours ?LC reviewed doc flow sheets, Baby has fed once 30 mins and since then attempts.  ?HNS yet.  ?[place] reassured mom since the baby has not stooled yet, he is not hungry.  ?LC will F/U .  ? ?Maternal Data ?  ? ?Feeding ?Mother's Current Feeding Choice: Breast Milk and Formula ? ?LATCH Score ( Latch score attempt )  ?Latch: Repeated attempts needed to sustain latch, nipple held in mouth throughout feeding, stimulation needed to elicit sucking reflex. ? ?Audible Swallowing: None ? ?Type of Nipple: Everted at rest and after stimulation ? ?Comfort (Breast/Nipple): Soft / non-tender ? ?Hold (Positioning): Assistance needed to correctly position infant at breast and maintain latch. ? ?LATCH Score: 6 ? ? ?Lactation Tools Discussed/Used ?  ? ?Interventions ?Interventions: Breast feeding basics reviewed ? ?Discharge ?  ? ?Consult Status ?Consult Status: Follow-up ?Date: 12/30/21 ?Follow-up type: In-patient ? ? ? ?Jerlyn Ly Dam Ashraf ?12/30/2021, 8:21 AM ? ? ? ?

## 2021-12-30 NOTE — Lactation Note (Signed)
This note was copied from a baby's chart. ?Lactation Consultation Note ? ?Patient Name: Tiffany Deleon ?Today's Date: 12/30/2021 ?Reason for consult: Follow-up assessment;Term;Breastfeeding assistance;Other (Comment);RN request ?Age:27 hours ?2nd [place] visit for feeding assessment.  ?Baby more awake and temperature stable.  ?LC offered to assist and mom receptive.  ?[place] placed baby on the right breast football position and baby latched  ?Easily with depth, flanged lips and swallows. Per mom comfortable at the breast except cramping.  ?Latch score 9 .  ? ?Maternal Data ?Has patient been taught Hand Expression?: Yes ?Does the patient have breastfeeding experience prior to this delivery?: Yes ?How long did the patient breastfeed?: per mom didn't breastfeed very long ? ?Feeding ?Mother's Current Feeding Choice: Breast Milk and Formula ? ?LATCH Score ?Latch: Grasps breast easily, tongue down, lips flanged, rhythmical sucking. ? ?Audible Swallowing: Spontaneous and intermittent ? ?Type of Nipple: Everted at rest and after stimulation ? ?Comfort (Breast/Nipple): Soft / non-tender ? ?Hold (Positioning): Assistance needed to correctly position infant at breast and maintain latch. ? ?LATCH Score: 9 ? ? ?Lactation Tools Discussed/Used ?  ? ?Interventions ?Interventions: Breast feeding basics reviewed;Assisted with latch;Skin to skin;Breast massage;Hand express;Reverse pressure;Breast compression;Adjust position;Support pillows;Education;LC Services brochure ? ?Discharge ?  ? ?Consult Status ?Consult Status: Follow-up ?Date: 12/31/21 ?Follow-up type: In-patient ? ? ? ?Jerlyn Ly Allani Reber ?12/30/2021, 11:36 AM ? ? ? ?

## 2021-12-31 MED ORDER — POLYETHYLENE GLYCOL 3350 17 G PO PACK: 17.0000 g | PACK | Freq: Every day | ORAL | 0 refills | Status: AC

## 2021-12-31 MED ORDER — SENNOSIDES-DOCUSATE SODIUM 8.6-50 MG PO TABS
2.0000 | ORAL_TABLET | Freq: Every evening | ORAL | 0 refills | Status: AC | PRN
Start: 2021-12-31 — End: 2022-01-10

## 2021-12-31 MED ORDER — ACETAMINOPHEN 325 MG PO TABS: 650.0000 mg | ORAL_TABLET | ORAL | 0 refills | Status: AC | PRN

## 2021-12-31 MED ORDER — IBUPROFEN 100 MG/5ML PO SUSP: 600.0000 mg | Freq: Four times a day (QID) | ORAL | 0 refills | Status: AC

## 2021-12-31 NOTE — Lactation Note (Signed)
This note was copied from a baby's chart. ?Lactation Consultation Note ? ?Patient Name: Tiffany Deleon ?Today's Date: 12/31/2021 ?Reason for consult: Follow-up assessment;Mother's request;Term;Breastfeeding assistance ?Age:28 hours ? ?LC assisted with latching infant at the breast with signs of milk transfer. Mom infant latched in cradle with changed to cross cradle with more milk transfer.  ?Mom aware compression stripe of nipple signs of shallow latch and not enough breast tissue in the latch.  ? ?Plan 1 To feed based on cues 8-12x 24hr period. Mom to offer breasts with compression to offer more volume noting signs of milk transfer.  ?2. Mom to supplement with EBM first followed by formula 7-12 ml per feeding with pace bottle feeding using yellow slow flow nipple. BF supplementation guide provided.  ?3. Mom to use manual pump q 3hrs for 10 min each breast.  ? ?All questions answered at the end of the visit.  ?[facility] referral sent to get a pump for home.  ? ?Maternal Data ?Has patient been taught Hand Expression?: Yes ?Does the patient have breastfeeding experience prior to this delivery?: Yes ? ?Feeding ?Mother's Current Feeding Choice: Breast Milk and Formula ? ?LATCH Score ?Latch: Repeated attempts needed to sustain latch, nipple held in mouth throughout feeding, stimulation needed to elicit sucking reflex. ? ?Audible Swallowing: Spontaneous and intermittent ? ?Type of Nipple: Everted at rest and after stimulation ? ?Comfort (Breast/Nipple): Soft / non-tender ? ?Hold (Positioning): Assistance needed to correctly position infant at breast and maintain latch. ? ?LATCH Score: 8 ? ? ?Lactation Tools Discussed/Used ?Tools: Flanges;Pump ?Flange Size: 24 ?Breast pump type: Manual ?Pump Education: Setup, frequency, and cleaning;Milk Storage ?Reason for Pumping: increase stimulation ?Pumping frequency: every 3 hrs for 10 min each breast ? ?Interventions ?Interventions: Breast feeding basics reviewed;Assisted with  latch;Skin to skin;Breast massage;Hand express;Breast compression;Position options;Expressed milk;Hand pump;Education;Pace feeding;LC Psychologist, educational;Infant Driven Feeding Algorithm education ? ?Discharge ?Discharge Education: Engorgement and breast care;Warning signs for feeding baby ?Pump: Manual ?WIC Program: Yes ? ?Consult Status ?Consult Status: Complete ?Date: 12/31/21 ?Follow-up type: In-patient ? ? ? ?Ridhima Golberg  Nicholson[place]Springer ?12/31/2021, 4:25 PM ? ? ? ?

## 2022-01-06 ENCOUNTER — Telehealth: Payer: Self-pay

## 2022-01-06 ENCOUNTER — Ambulatory Visit: Payer: Medicaid Other

## 2022-01-06 NOTE — Telephone Encounter (Signed)
Patient returned call to [facility], [place]. Nolberto Hanlon, RN unavailable so I spoke with patient. Pt denies any pain or incontinence (bowel or bladder). Reports normal PP bleeding. Per Para March, MD, patient only needs provider visit to examine perineum if she has any concerns. Pt to return for PP visit. ?

## 2022-01-08 ENCOUNTER — Telehealth (HOSPITAL_COMMUNITY): Payer: Self-pay

## 2022-01-08 NOTE — Telephone Encounter (Signed)
?  No answer. Left message to return nurse call. ? Heron Nay ?01/08/2022,1615 ?

## 2022-01-29 ENCOUNTER — Ambulatory Visit: Payer: Medicaid Other | Admitting: Family Medicine

## 2022-02-02 ENCOUNTER — Encounter: Payer: Self-pay | Admitting: Family Medicine

## 2022-02-02 ENCOUNTER — Telehealth: Payer: Self-pay | Admitting: Family Medicine

## 2022-02-02 NOTE — Telephone Encounter (Signed)
Called patient to reschedule postpartum appointment, there was no answer to the phone call and the option to leave a voicemail was unavailable, a letter was sent in the mail.  ?

## 2023-03-09 ENCOUNTER — Emergency Department (HOSPITAL_COMMUNITY): Payer: Medicaid Other

## 2023-03-09 ENCOUNTER — Other Ambulatory Visit: Payer: Self-pay

## 2023-03-09 ENCOUNTER — Encounter (HOSPITAL_COMMUNITY): Payer: Self-pay | Admitting: Emergency Medicine

## 2023-03-09 ENCOUNTER — Emergency Department (HOSPITAL_COMMUNITY)
Admission: EM | Admit: 2023-03-09 | Discharge: 2023-03-09 | Disposition: A | Discharge status: Home / Self Care | Payer: Medicaid Other | Attending: Emergency Medicine | Admitting: Emergency Medicine

## 2023-03-09 DIAGNOSIS — W2211XA Striking against or struck by driver side automobile airbag, initial encounter: Secondary | ICD-10-CM | POA: Insufficient documentation

## 2023-03-09 DIAGNOSIS — H579 Unspecified disorder of eye and adnexa: Secondary | ICD-10-CM | POA: Insufficient documentation

## 2023-03-09 DIAGNOSIS — S060X1A Concussion with loss of consciousness of 30 minutes or less, initial encounter: Secondary | ICD-10-CM | POA: Insufficient documentation

## 2023-03-09 DIAGNOSIS — Y92014 Private driveway to single-family (private) house as the place of occurrence of the external cause: Secondary | ICD-10-CM | POA: Insufficient documentation

## 2023-03-09 DIAGNOSIS — S0990XA Unspecified injury of head, initial encounter: Secondary | ICD-10-CM

## 2023-03-09 MED ORDER — ERYTHROMYCIN 5 MG/GM OP OINT: TOPICAL_OINTMENT | OPHTHALMIC | 0 refills | Status: AC

## 2023-03-09 MED ORDER — ONDANSETRON 4 MG PO TBDP: ORAL_TABLET | ORAL | 0 refills | Status: AC

## 2023-03-09 MED ORDER — ACETAMINOPHEN 500 MG PO TABS
1000.0000 mg | ORAL_TABLET | Freq: Once | ORAL | Status: DC
  Filled 2023-03-09: qty 2

## 2023-03-09 MED ORDER — ACETAMINOPHEN 160 MG/5ML PO SOLN: 650.0000 mg | Freq: Once | ORAL | Status: DC

## 2023-03-09 MED ORDER — ONDANSETRON 4 MG PO TBDP
4.0000 mg | ORAL_TABLET | Freq: Once | ORAL | Status: AC
  Administered 2023-03-09: 4 mg via ORAL
  Filled 2023-03-09: qty 1

## 2023-03-09 NOTE — Discharge Instructions (Signed)
Return for uncontrolled pain or weakness, numbness, passing out, recurrent vomiting or new concerns. Use Zofran every 6 hours needed for nausea and vomiting.

## 2023-03-09 NOTE — ED Notes (Signed)
Patient says she needs to go to bathroom.  Asked patient if she feels like she can walk to bathroom.  Patient shook head no. Asked patient if she would like bedpan and patient indicated she would. NT taking bedpan to patient.
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
## 2023-03-09 NOTE — ED Notes (Signed)
Patient transported to X-ray 

## 2023-03-09 NOTE — ED Notes (Signed)
Pt requesting tylenol to be in liquid form.

## 2023-03-09 NOTE — ED Notes (Signed)
ED Provider at bedside. 

## 2023-07-01 IMAGING — US US MFM OB DETAIL+14 WK
1 series · 13 of 28 positions shown · non-contrast
Comparison: none

[Series 1: us mfm ob detail+14 wk · 128 acquisitions, 13 frames shown]
[im 5/128]
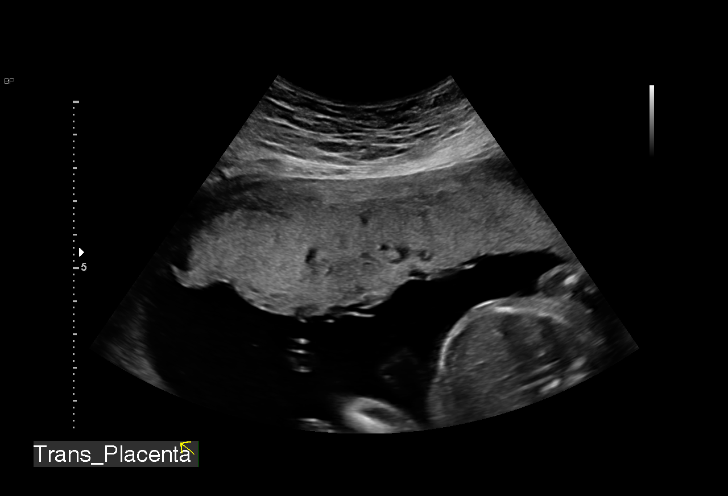
[im 15/128]
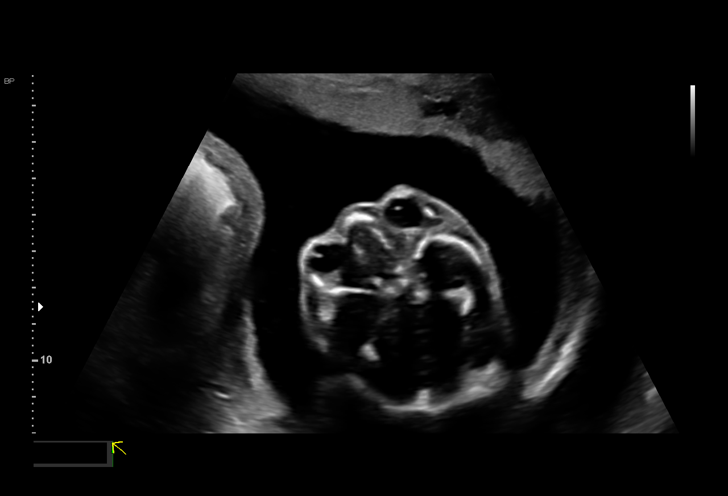
[im 24/128]
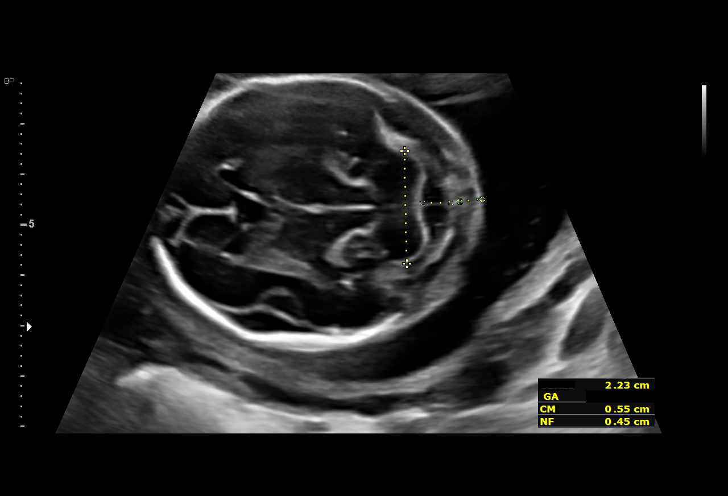
[im 33/128]
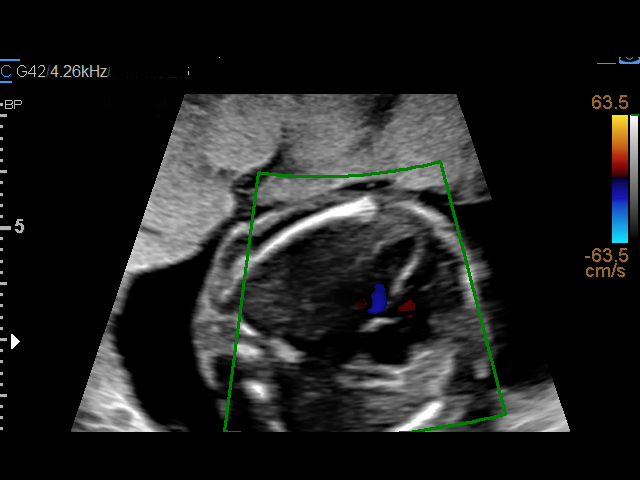
[im 43/128]
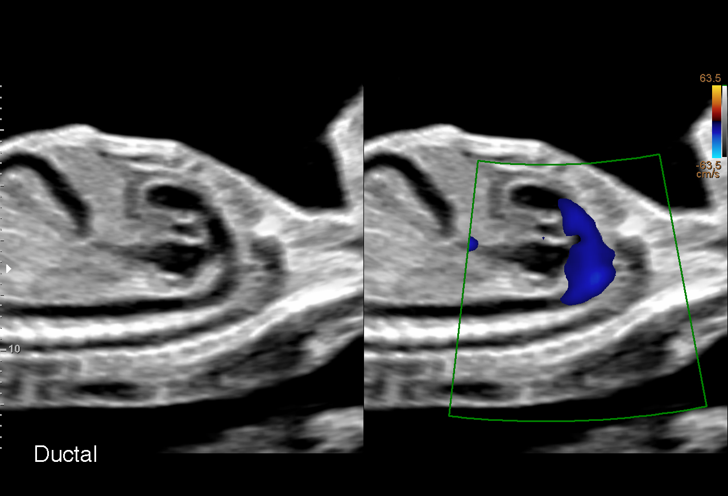
[im 52/128]
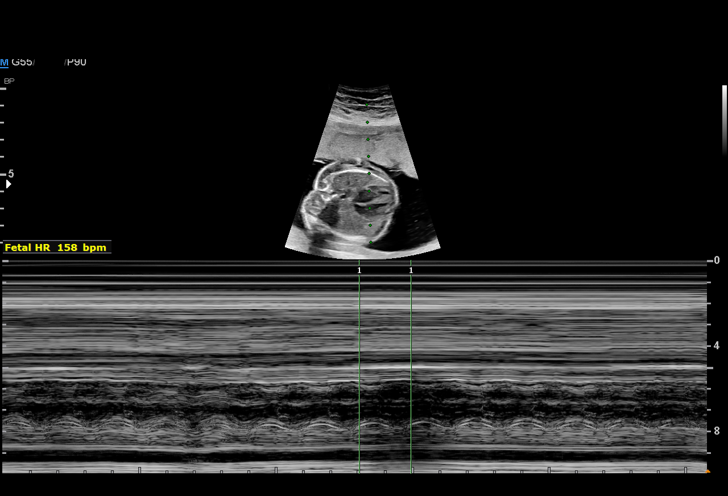
[im 66/128]
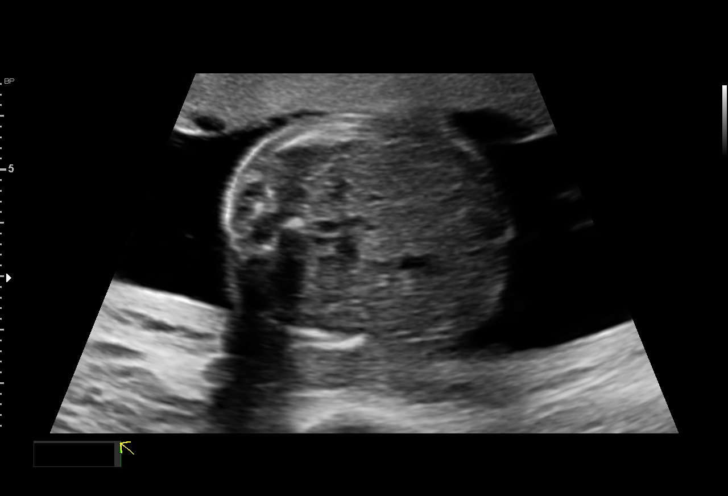
[im 76/128]
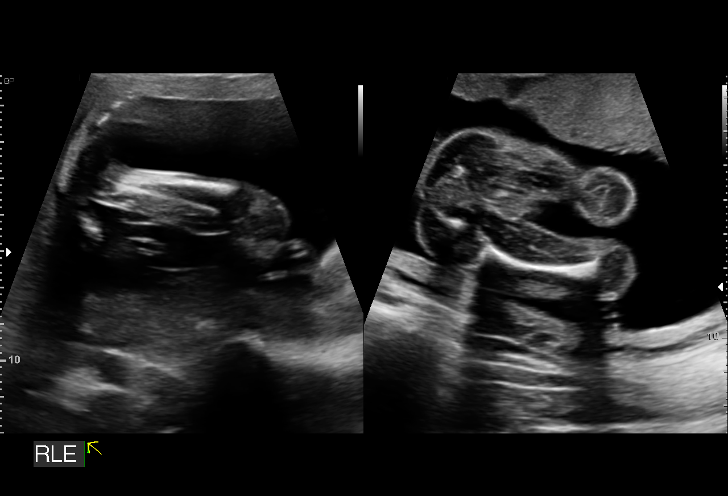
[im 85/128]
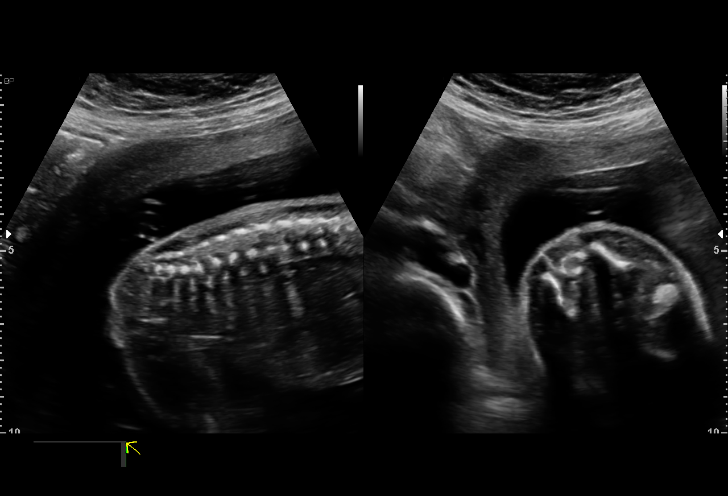
[im 95/128]
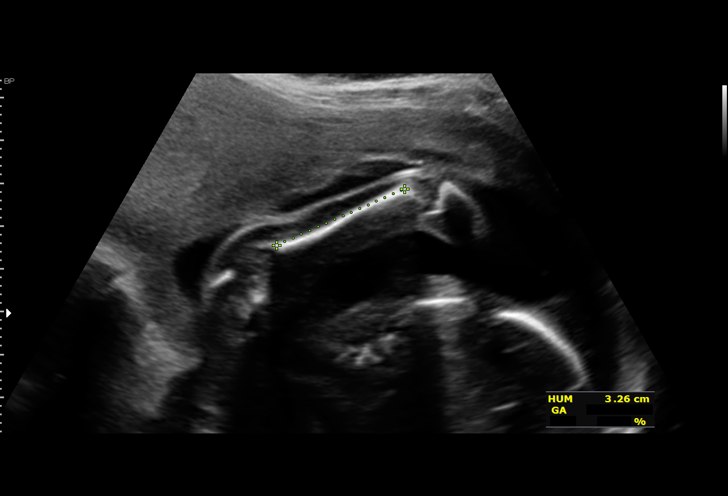
[im 104/128]
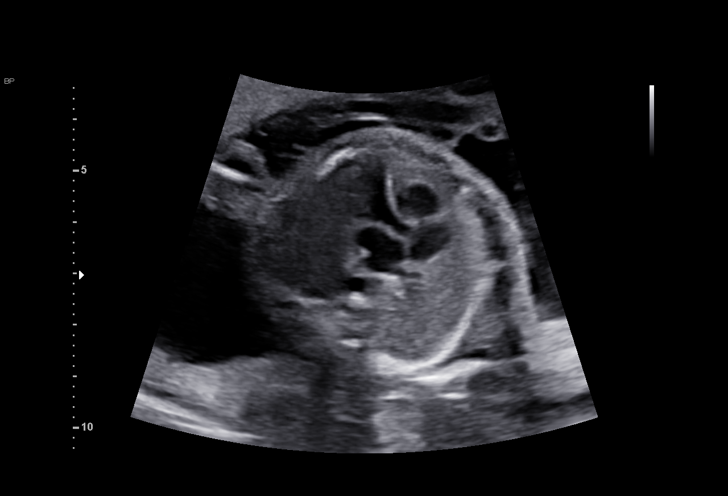
[im 113/128]
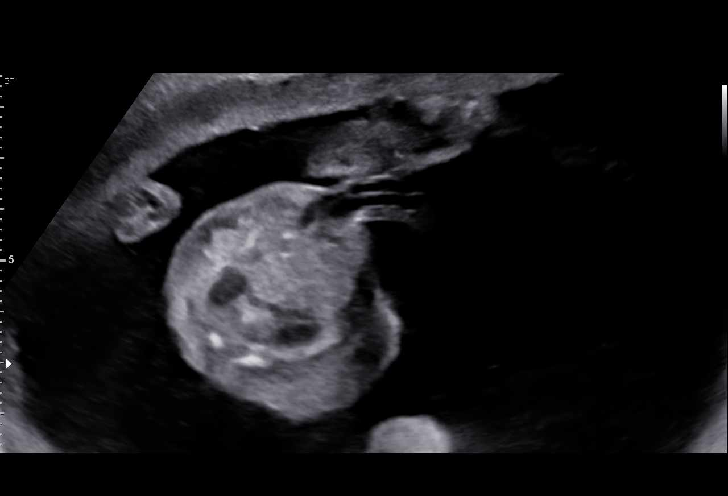
[im 123/128]
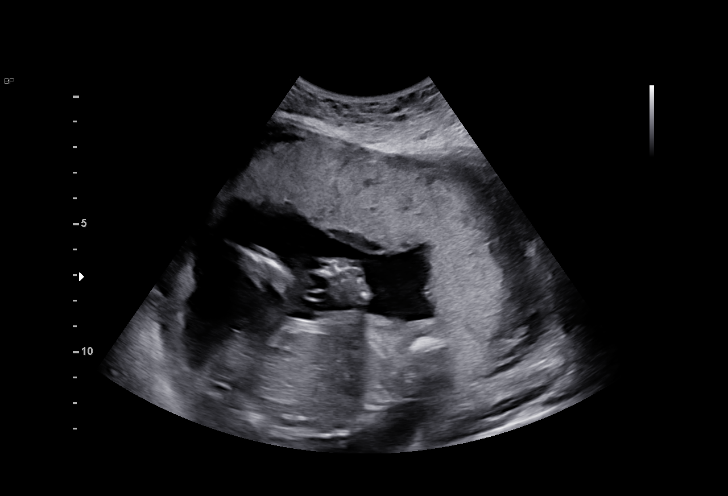

[13 of 28 positions shown; findings below may reference images not displayed]

[facility]

Indications

 Abnormal chromosomal and genetic finding
 on antenatal screening of mother (atypical
 finding for sex chromosomes)
 Poor obstetric history: Previous preterm
 delivery, antepartum
 21 weeks gestation of pregnancy
 Encounter for cervical length
 Encounter for antenatal screening for
 malformations
Fetal Evaluation

 Num Of Fetuses:         1
 Fetal Heart Rate(bpm):  158
 Cardiac Activity:       Observed
 Presentation:           Cephalic
 Placenta:               Anterior
 P. Cord Insertion:      Previously Visualized

 Amniotic Fluid
 AFI FV:      Within normal limits

                             Largest Pocket(cm)

Biometry
 BPD:      52.2  mm     G. Age:  21w 6d         76  %    CI:        76.06   %    70 - 86
                                                         FL/HC:      18.5   %    15.9 -
 HC:      189.7  mm     G. Age:  21w 2d         45  %    HC/AC:      1.14        1.06 -
 AC:      166.4  mm     G. Age:  21w 5d         61  %    FL/BPD:     67.0   %
 FL:         35  mm     G. Age:  21w 0d         37  %    FL/AC:      21.0   %    20 - 24
 HUM:        33  mm     G. Age:  21w 1d         47  %
 CER:      22.3  mm     G. Age:  20w 6d         54  %
 NFT:       4.5  mm

 LV:        5.3  mm
 CM:        5.5  mm

 Est. FW:     420  gm    0 lb 15 oz      58  %
OB History

 Gravidity:    3         Term:   1        Prem:   1
 Living:       3
Gestational Age

 LMP:           21w 1d        Date:  03/25/21                 EDD:   12/30/21
 U/S Today:     21w 3d                                        EDD:   12/28/21
 Best:          21w 1d     Det. By:  LMP  (03/25/21)          EDD:   12/30/21
Anatomy

 Cranium:               Appears normal         LVOT:                   Appears normal
 Cavum:                 Appears normal         Aortic Arch:            Appears normal
 Ventricles:            Appears normal         Ductal Arch:            Appears normal
 Choroid Plexus:        Appears normal         Diaphragm:              Appears normal
 Cerebellum:            Appears normal         Stomach:                Appears normal, left
                                                                       sided
 Posterior Fossa:       Appears normal         Abdomen:                Appears normal
 Nuchal Fold:           Previously seen        Abdominal Wall:         Appears nml (cord
                                                                       insert, abd wall)
 Face:                  Appears normal         Cord Vessels:           Appears normal (3
                        (orbits and profile)                           vessel cord)
 Lips:                  Appears normal         Kidneys:                Appear normal
 Palate:                Appears normal         Bladder:                Appears normal
 Thoracic:              Appears normal         Spine:                  Appears normal
 Heart:                 Appears normal         Upper Extremities:      Appears normal
                        (4CH, axis, and
                        situs)
 RVOT:                  Appears normal         Lower Extremities:      Appears normal

 Other:  Fetus appears to be a male. Nasal bone, lenses, open hands/ 5th
         digit, feet/heels, VC, 3VV, 3VT visualized.
Cervix Uterus Adnexa

 Cervix
 Length:           3.34  cm.
 Normal appearance by transabdominal scan.

 Uterus
 No abnormality visualized.
 Right Ovary
 Not visualized.

 Left Ovary
 Within normal limits.

 Cul De Sac
 No free fluid seen.

 Adnexa
 No abnormality visualized.
Comments

 This patient was seen for a detailed fetal anatomy scan as
 her cell free DNA test indicated atypical findings on the sex
 chromosomes.  She denies any problems since her last exam.
 She was informed that the fetal growth and amniotic fluid
 level were appropriate for her gestational age.
 There were no obvious fetal anomalies noted on today's
 ultrasound exam.
 The patient was informed that anomalies may be missed due
 to technical limitations. If the fetus is in a suboptimal position
 or maternal habitus is increased, visualization of the fetus in
 the maternal uterus may be impaired.
 The patient was offered and declined an amniocentesis for
 definitive diagnosis of fetal sex chromosome abnormalities
 and fetal aneuploidy.
 Due to the atypical findings on her cell free DNA test, we will
 continue to follow her with growth ultrasounds throughout her
 pregnancy.
 A follow-up exam was scheduled in 5 weeks.

## 2023-08-02 ENCOUNTER — Telehealth: Payer: Self-pay | Admitting: Family Medicine

## 2023-08-02 NOTE — Telephone Encounter (Signed)
Patient called requesting to have the metronidazole medication refilled but in the gel form if possible. Her pharmacy is [facility] located at [address], [place], [place] [address].

## 2023-08-03 NOTE — Telephone Encounter (Signed)
Called patient back, she did not answer. LM for her to call the office back as more information needed to refill RX request.

## 2023-12-27 ENCOUNTER — Encounter (HOSPITAL_BASED_OUTPATIENT_CLINIC_OR_DEPARTMENT_OTHER): Payer: Self-pay | Admitting: Emergency Medicine

## 2023-12-27 ENCOUNTER — Other Ambulatory Visit: Payer: Self-pay

## 2023-12-27 ENCOUNTER — Emergency Department (HOSPITAL_BASED_OUTPATIENT_CLINIC_OR_DEPARTMENT_OTHER)
Admission: EM | Admit: 2023-12-27 | Discharge: 2023-12-27 | Disposition: A | Discharge status: Home / Self Care | Attending: Emergency Medicine | Admitting: Emergency Medicine

## 2023-12-27 DIAGNOSIS — H1012 Acute atopic conjunctivitis, left eye: Secondary | ICD-10-CM | POA: Insufficient documentation

## 2023-12-27 DIAGNOSIS — H5789 Other specified disorders of eye and adnexa: Secondary | ICD-10-CM | POA: Diagnosis present

## 2023-12-27 DIAGNOSIS — R0981 Nasal congestion: Secondary | ICD-10-CM | POA: Insufficient documentation

## 2023-12-27 MED ORDER — TETRACAINE HCL 0.5 % OP SOLN
2.0000 [drp] | Freq: Once | OPHTHALMIC | Status: AC
  Administered 2023-12-27: 2 [drp] via OPHTHALMIC
  Filled 2023-12-27: qty 4

## 2023-12-27 MED ORDER — FLUORESCEIN SODIUM 1 MG OP STRP
1.0000 | ORAL_STRIP | Freq: Once | OPHTHALMIC | Status: AC
  Administered 2023-12-27: 1 via OPHTHALMIC
  Filled 2023-12-27: qty 1

## 2023-12-27 NOTE — ED Triage Notes (Signed)
 Pt POV steady gait- c/o L eye redness and drainage since last night.  Also reports runny nose for same time. Denies known sick contacts. Reports minor blurred vision.

## 2023-12-27 NOTE — ED Provider Notes (Signed)
  EMERGENCY DEPARTMENT AT [facility] Provider Note   CSN: [id number] Arrival date & time: 12/27/23  1039     History  Chief Complaint  Patient presents with   Eye Drainage    Tiffany Deleon is a 28 y.o. female.  This is a 28 year old female who is here today for runny nose and a red left eye, tearing.  Symptoms began last evening.        Home Medications Prior to Admission medications   Medication Sig Start Date End Date Taking? Authorizing Provider  acetaminophen (TYLENOL) 325 MG tablet Take 2 tablets (650 mg total) by mouth every 4 (four) hours as needed (for pain scale < 4). 12/31/21   Warner Mccreedy, MD  erythromycin ophthalmic ointment Place a 1/2 inch ribbon of ointment into the lower eyelid twice a day and affect the eye for 5 days. 03/09/23   Blane Ohara, MD  ibuprofen (ADVIL) 100 MG/5ML suspension Take 30 mLs (600 mg total) by mouth every 6 (six) hours. 12/31/21   Warner Mccreedy, MD  ondansetron (ZOFRAN-ODT) 4 MG disintegrating tablet 4mg  ODT q6 hours prn nausea/vomit 03/09/23   Blane Ohara, MD  polyethylene glycol (MIRALAX / GLYCOLAX) 17 g packet Take 17 g by mouth daily. 12/31/21   Warner Mccreedy, MD  Prenatal Vit-Fe Fumarate-FA (PRENATAL VITAMIN PO) Take by mouth.    [provider]      Allergies    Patient has no known allergies.    Review of Systems   Review of Systems  Physical Exam Updated Vital Signs BP 119/72   Pulse 75   Temp 98 F (36.7 C) (Oral)   Resp 15   Ht 5\' 5"  (1.651 m)   LMP 12/27/2023 (Exact Date)   SpO2 100%   BMI 20.25 kg/m  Physical Exam Vitals reviewed.  HENT:     Head: Normocephalic.     Nose: Congestion present.  Eyes:     Comments: Pupils equal and reactive.  Right eye-red conjunctiva, mild tearing.  Extraocular muscles intact.  No periorbital swelling or erythema.  No tenderness.  No pus, no purulence.  Cardiovascular:     Rate and Rhythm: Normal rate.  Pulmonary:     Effort: Pulmonary effort is  normal.  Neurological:     Mental Status: She is alert.     ED Results / Procedures / Treatments   Labs (all labs ordered are listed, but only abnormal results are displayed) Labs Reviewed - No data to display  EKG None  Radiology No results found.  Procedures Procedures    Medications Ordered in ED Medications  tetracaine (PONTOCAINE) 0.5 % ophthalmic solution 2 drop (2 drops Left Eye Given 12/27/23 1124)  fluorescein ophthalmic strip 1 strip (1 strip Left Eye Given 12/27/23 1124)    ED Course/ Medical Decision Making/ A&P                                 Medical Decision Making 28 year old female is here today with a red eye  Plan-with the constellation of patient's symptoms, this is likely viral or allergic conjunctivitis.  No signs of infection.  No visual disturbances.  Low suspicion for angle-closure glaucoma, elevated intraocular pressure.  No signs of periorbital, orbital, or preseptal infection.  Counseled patient, she will follow-up with her PCP return to the emergency room if symptoms worsen.  Risk Prescription drug management.  Final Clinical Impression(s) / ED Diagnoses Final diagnoses:  Acute atopic conjunctivitis of left eye    Rx / DC Orders ED Discharge Orders     None         Arletha Pili, DO 12/27/23 1203

## 2023-12-27 NOTE — Discharge Instructions (Addendum)
 You are diagnosed with conjunctivitis.  Based on your symptoms, this is either viral or allergic.  1 thing that you can do over the coming days does take an oral antihistamine like loratadine or Zyrtec.  This can help out if this is allergic, and will help out with your runny nose.  Symptoms should begin to improve over the next few days.  Return to the emergency room if you develop difficulty seeing, worsening pain in your eye.
